# Patient Record
Sex: Female | Born: 1960 | Race: White | Hispanic: No | Smoking: Former smoker
Health system: Southern US, Community
[De-identification: ages and names within clinical notes are randomized; demographics above are authoritative.]

## PROBLEM LIST (undated history)

## (undated) DIAGNOSIS — R5383 Other fatigue: Secondary | ICD-10-CM

## (undated) DIAGNOSIS — E119 Type 2 diabetes mellitus without complications: Secondary | ICD-10-CM

## (undated) DIAGNOSIS — R159 Full incontinence of feces: Secondary | ICD-10-CM

## (undated) DIAGNOSIS — R32 Unspecified urinary incontinence: Secondary | ICD-10-CM

## (undated) DIAGNOSIS — D580 Hereditary spherocytosis: Secondary | ICD-10-CM

## (undated) DIAGNOSIS — D72824 Basophilia: Secondary | ICD-10-CM

## (undated) DIAGNOSIS — K8020 Calculus of gallbladder without cholecystitis without obstruction: Secondary | ICD-10-CM

## (undated) DIAGNOSIS — R937 Abnormal findings on diagnostic imaging of other parts of musculoskeletal system: Principal | ICD-10-CM

## (undated) DIAGNOSIS — D72829 Elevated white blood cell count, unspecified: Secondary | ICD-10-CM

## (undated) HISTORY — DX: Basophilia: D72.824

## (undated) HISTORY — PX: OTHER SURGICAL HISTORY: SHX169

## (undated) HISTORY — DX: Calculus of gallbladder without cholecystitis without obstruction: K80.20

## (undated) HISTORY — DX: Unspecified urinary incontinence: R32

## (undated) HISTORY — DX: Other fatigue: R53.83

## (undated) HISTORY — DX: Hereditary spherocytosis: D58.0

## (undated) HISTORY — DX: Abnormal findings on diagnostic imaging of other parts of musculoskeletal system: R93.7

## (undated) HISTORY — DX: Elevated white blood cell count, unspecified: D72.829

## (undated) HISTORY — DX: Full incontinence of feces: R15.9

## (undated) HISTORY — DX: Type 2 diabetes mellitus without complications: E11.9

---

## 1993-10-02 DIAGNOSIS — K8020 Calculus of gallbladder without cholecystitis without obstruction: Secondary | ICD-10-CM

## 1993-10-02 HISTORY — DX: Calculus of gallbladder without cholecystitis without obstruction: K80.20

## 2000-05-28 ENCOUNTER — Other Ambulatory Visit: Admission: RE | Admit: 2000-05-28 | Discharge: 2000-05-28 | Payer: Self-pay | Admitting: Family Medicine

## 2011-12-13 ENCOUNTER — Other Ambulatory Visit: Payer: Self-pay | Admitting: Family Medicine

## 2011-12-13 DIAGNOSIS — Z1231 Encounter for screening mammogram for malignant neoplasm of breast: Secondary | ICD-10-CM

## 2012-01-04 ENCOUNTER — Ambulatory Visit: Payer: Self-pay

## 2012-11-21 ENCOUNTER — Other Ambulatory Visit: Payer: Self-pay | Admitting: Family Medicine

## 2012-11-25 ENCOUNTER — Ambulatory Visit
Admission: RE | Admit: 2012-11-25 | Discharge: 2012-11-25 | Disposition: A | Discharge status: Home / Self Care | Payer: 59 | Source: Ambulatory Visit | Attending: Family Medicine | Admitting: Family Medicine

## 2012-11-25 DIAGNOSIS — R109 Unspecified abdominal pain: Secondary | ICD-10-CM

## 2012-11-25 MED ORDER — IOHEXOL 300 MG/ML  SOLN
100.0000 mL | Freq: Once | INTRAMUSCULAR | Status: AC | PRN
  Administered 2012-11-25: 100 mL via INTRAVENOUS

## 2013-11-14 ENCOUNTER — Other Ambulatory Visit: Payer: Self-pay | Admitting: Family Medicine

## 2013-11-14 DIAGNOSIS — M545 Low back pain, unspecified: Secondary | ICD-10-CM

## 2013-11-25 ENCOUNTER — Other Ambulatory Visit: Payer: 59

## 2014-01-28 ENCOUNTER — Other Ambulatory Visit: Payer: Self-pay | Admitting: Oncology

## 2014-01-28 ENCOUNTER — Encounter: Payer: Self-pay | Admitting: Oncology

## 2014-01-28 DIAGNOSIS — R161 Splenomegaly, not elsewhere classified: Secondary | ICD-10-CM

## 2014-01-28 DIAGNOSIS — R937 Abnormal findings on diagnostic imaging of other parts of musculoskeletal system: Secondary | ICD-10-CM

## 2014-01-28 HISTORY — DX: Abnormal findings on diagnostic imaging of other parts of musculoskeletal system: R93.7

## 2014-02-03 ENCOUNTER — Other Ambulatory Visit (INDEPENDENT_AMBULATORY_CARE_PROVIDER_SITE_OTHER): Payer: 59

## 2014-02-03 DIAGNOSIS — R937 Abnormal findings on diagnostic imaging of other parts of musculoskeletal system: Secondary | ICD-10-CM

## 2014-02-03 DIAGNOSIS — R161 Splenomegaly, not elsewhere classified: Secondary | ICD-10-CM

## 2014-02-03 LAB — COMPREHENSIVE METABOLIC PANEL
ALK PHOS: 87 U/L (ref 39–117)
ALT: 25 U/L (ref 0–35)
AST: 28 U/L (ref 0–37)
Albumin: 4 g/dL (ref 3.5–5.2)
BILIRUBIN TOTAL: 0.8 mg/dL (ref 0.3–1.2)
BUN: 12 mg/dL (ref 6–23)
CHLORIDE: 104 meq/L (ref 96–112)
CO2: 23 meq/L (ref 19–32)
Calcium: 9.3 mg/dL (ref 8.4–10.5)
Creatinine, Ser: 0.69 mg/dL (ref 0.50–1.10)
GFR calc Af Amer: >90 mL/min (ref >90)
Glucose, Bld: 211 mg/dL — ABNORMAL HIGH (ref 70–99)
POTASSIUM: 4.2 meq/L (ref 3.7–5.3)
Sodium: 141 mEq/L (ref 137–147)
Total Protein: 6.7 g/dL (ref 6.0–8.3)

## 2014-02-03 LAB — CBC WITH DIFFERENTIAL/PLATELET
BASOS ABS: 0.1 10*3/uL (ref 0.0–0.1)
Basophils Relative: 1 % (ref 0–1)
EOS PCT: 5 % (ref 0–5)
Eosinophils Absolute: 0.5 10*3/uL (ref 0.0–0.7)
HCT: 41.6 % (ref 36.0–46.0)
Hemoglobin: 15.2 g/dL — ABNORMAL HIGH (ref 12.0–15.0)
LYMPHS ABS: 3.4 10*3/uL (ref 0.7–4.0)
LYMPHS PCT: 35 % (ref 12–46)
MCH: 34.8 pg — ABNORMAL HIGH (ref 26.0–34.0)
MCHC: 36.5 g/dL — ABNORMAL HIGH (ref 30.0–36.0)
MCV: 95.2 fL (ref 78.0–100.0)
MONO ABS: 0.5 10*3/uL (ref 0.1–1.0)
Monocytes Relative: 5 % (ref 3–12)
NEUTROS ABS: 5.4 10*3/uL (ref 1.7–7.7)
Neutrophils Relative %: 54 % (ref 43–77)
Platelets: 180 10*3/uL (ref 150–400)
RBC: 4.37 MIL/uL (ref 3.87–5.11)
RDW: 17.5 % — AB (ref 11.5–15.5)
WBC: 9.9 10*3/uL (ref 4.0–10.5)

## 2014-02-03 LAB — LACTATE DEHYDROGENASE: LDH: 254 U/L — ABNORMAL HIGH (ref 94–250)

## 2014-02-03 LAB — RETICULOCYTES
RBC.: 4.37 MIL/uL (ref 3.87–5.11)
RETIC COUNT ABSOLUTE: 249.1 10*3/uL — AB (ref 19.0–186.0)
RETIC CT PCT: 5.7 % — AB (ref 0.4–3.1)

## 2014-02-03 LAB — SEDIMENTATION RATE: Sed Rate: 5 mm/hr (ref 0–22)

## 2014-02-03 LAB — SAVE SMEAR

## 2014-02-04 LAB — IGG, IGA, IGM
IGM, SERUM: 134 mg/dL (ref 52–322)
IgA: 185 mg/dL (ref 69–380)
IgG (Immunoglobin G), Serum: 955 mg/dL (ref 690–1700)

## 2014-02-04 LAB — ANA: Anti Nuclear Antibody(ANA): NEGATIVE

## 2014-02-05 LAB — IMMUNOFIXATION ELECTROPHORESIS
IGG (IMMUNOGLOBIN G), SERUM: 955 mg/dL (ref 690–1700)
IgA: 185 mg/dL (ref 69–380)
IgM, Serum: 134 mg/dL (ref 52–322)
TOTAL PROTEIN, SERUM ELECTROPHOR: 7 g/dL (ref 6.0–8.3)

## 2014-02-09 ENCOUNTER — Encounter: Payer: Self-pay | Admitting: Oncology

## 2014-02-09 ENCOUNTER — Ambulatory Visit (INDEPENDENT_AMBULATORY_CARE_PROVIDER_SITE_OTHER): Payer: 59 | Admitting: Oncology

## 2014-02-09 VITALS — BP 130/77 | HR 73 | Temp 98.9°F | Ht 66.5 in | Wt 205.8 lb

## 2014-02-09 DIAGNOSIS — D72824 Basophilia: Secondary | ICD-10-CM

## 2014-02-09 DIAGNOSIS — D580 Hereditary spherocytosis: Secondary | ICD-10-CM

## 2014-02-09 DIAGNOSIS — D72829 Elevated white blood cell count, unspecified: Secondary | ICD-10-CM

## 2014-02-09 DIAGNOSIS — R161 Splenomegaly, not elsewhere classified: Secondary | ICD-10-CM

## 2014-02-09 HISTORY — DX: Basophilia: D72.824

## 2014-02-09 HISTORY — DX: Elevated white blood cell count, unspecified: D72.829

## 2014-02-09 NOTE — Progress Notes (Signed)
Patient ID: Robyn Green, female   DOB: 09/01/1961, 53 y.o.   MRN: [id number] New Patient Hematology-Oncology Evaluation   Finn L Seevers [id number] March 19, 1961 53 y.o. 02/09/2014  CC: Dr. Claris Gower; Dr.Hao Mina Marble   Reason for referral: Abnormal bone marrow signal seen on an MRI done to evaluate acute on chronic back pain   HPI: 53 year old woman who has been in overall excellent health without any major medical or surgical illness. She has had chronic low back pain for over 10 years. About a year ago while stooping over at work she felt something pop. She has had severe midthoracic back pain since that time. She was referred to [place]. She had a MRI of the spine done on 01/20/2014. Findings included decreased T1 bone marrow signal throughout the thoracic and lumbar spine. No bone destruction. Widespread thoracic disc degeneration with additional degenerative changes. Disc protrusion at T2-3 which effaces the ventral CSF space on the right, a number of mild disc bulges at almost every level of the thoracic spine. I do not have a report on the lumbar spine MRI which was done the same day but I did review all scans with one of our neuro- radiologists. Although the bone marrow signal is abnormal, it is not specific. I looked back and found a CT scan of the abdomen done when she presented with right sided abdominal and flank pain on 11/25/2012. Mild splenomegaly, 16 cm, noted on that study. No organomegaly. Suspicion for early hepatic steatosis. She has had a number of laboratory tests done over the last year. On 11/19/2012, hemoglobin 14.3, MCV 99, white count 11,200, 53 neutrophils, 33 lymphocytes, 7 monocytes, 5 eosinophils, 1 basophil. No platelet count done. On 01/23/2014: Hemoglobin 14.8, white count 11,100, 82 neutrophils, 38 lymphocytes, 5 monocytes, 4 eosinophils, 1 basophil. Serum protein electrophoresis did not show a monoclonal protein spike. Repeat labs done in anticipation  of today's visit on 02/03/2014 with hemoglobin 15.1, MCV 95, white count 9900, platelets 180,000, ESR 5 mm, liver functions normal. Reticulocyte count elevated at 5.7%, LDH mildly elevated at 254, bilirubin normal at 0.8. ANA negative. Quantitative immunoglobulins are normal. No monoclonal protein seen on immunofixation electrophoresis of the serum.  She has never had any left-sided abdominal pain or discomfort. No early satiety. She had a laparoscopic cholecystectomy in 1994. She had mononucleosis as a teenager. 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 [place] The word brucellosis was not familiar to her.  On further questioning about family history, she tells me that her maternal grandmother, many maternal aunts and uncles, and their spleens out. Somebody mentioned hereditary spherocytosis. Her 83 year old mother accompanies her today. She has not had any blood problems. Ms. Isaacson has 2 brothers aged 44 and 23 with no known blood problems. A sister died at age 2 of liver cancer and a ruptured appendix.   PMH: Past Medical History  Diagnosis Date  . Abnormal x-ray of thoracic spine 01/28/2014    MRI thoracic spine 01/20/14: Diffuse abnormal bone marrow signal "this is non-specific and could reflect benign or malignant etiology"  . Leukocytosis, unspecified 02/09/2014  . 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 Negative MI. Negative stomach ulcers. Transient diabetes resolved with weight loss. No thyroid disease. She denies any history of hepatitis, yellow jaundice, or malaria. No seizure or stroke. No signs or symptoms  of a collagen vascular disorder. Chickenpox as a child. Mononucleosis as a teenager.  surgical history: See history of present illness  Allergies: Allergies  Allergen Reactions  . Bee Venom   . Codeine      Medications: No current outpatient prescriptions on file.  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 She continues to try to quit but usually goes back to smoking again; she does not drink alcohol or use illicit drugs.  Family History: See history of present illness  Review of Systems: She still gets some intermittent right-sided abdominal pain. She has almost constant pain in her spine from her neck all the way down to her sacrum. Pain radiating down the back of both legs. She had a early menopause in her 41s. She gets an occasional headache. No change in vision. No diplopia. No paresthesias. No cough or dyspnea. No ischemic type chest pain or palpitations. No change in bowel habit. Previous sigmoidoscopy reported to her as normal. She has never had a mammogram. No urinary tract symptoms. No rash or ecchymosis.   Physical Exam: Blood pressure 130/77, pulse 73, temperature 98.9 F (37.2 C), temperature source Oral, height 5' 6.5" (1.689 m), weight 205 lb 12.8 oz (93.35 kg), SpO2 97.00%. Wt Readings from Last 3 Encounters:  02/09/14 205 lb 12.8 oz (93.35 kg)     General appearance: Well-nourished Caucasian woman HENNT: Pharynx no erythema, exudate, mass, or ulcer. No thyromegaly or thyroid nodules Lymph nodes: No cervical, supraclavicular, or axillary lymphadenopathy Breasts:  Lungs: Clear to auscultation, resonant to percussion throughout Heart: Regular rhythm, no murmur, no gallop, no rub, no click, no edema Abdomen: Soft, nontender, normal bowel sounds, no mass, no organomegaly. I was unable to palpate an enlarged spleen either supine or in the right lateral decubitus position. Extremities: No edema, no calf tenderness Musculoskeletal: no joint deformities GU:  Vascular: Carotid pulses 2+, no bruits, distal pulses: Dorsalis pedis 1+ symmetric Neurologic: Alert, oriented, PERRLA, optic discs sharp  and vessels normal, no hemorrhage or exudate, cranial nerves grossly normal, motor strength 5 over 5, reflexes 1+ symmetric, upper body coordination normal, gait normal, vibration sensation normal to tuning fork exam over the fingertips and feet. Skin: No rash or ecchymosis    Lab Results: Lab Results  Component Value Date   WBC 9.9 02/03/2014   HGB 15.2* 02/03/2014   HCT 41.6 02/03/2014   MCV 95.2 02/03/2014   PLT 180 02/03/2014     Chemistry      Component Value Date/Time   NA 141 02/03/2014 0932   K 4.2 02/03/2014 0932   CL 104 02/03/2014 0932   CO2 23 02/03/2014 0932   BUN 12 02/03/2014 0932   CREATININE 0.69 02/03/2014 0932      Component Value Date/Time   CALCIUM 9.3 02/03/2014 0932   ALKPHOS 87 02/03/2014 0932   AST 28 02/03/2014 0932   ALT 25 02/03/2014 0932   BILITOT 0.8 02/03/2014 0932      Review of peripheral blood film: Homogeneous population of spherocytes. 2+ polychromasia. No basophilic stippling. No Red cell inclusions. No schistocytes. Rare teardrop cell. Mature neutrophils and lymphocytes. Normal platelets.   Radiological Studies: See discussion above     Impression and Plan: Spherocytosis and polychromasia on peripheral blood film, mild splenomegaly, signs of  a hemolytic process  with elevated reticulocyte count and LDH  correlated with diffuse abnormal bone marrow signal on MRI and family history with  many members having had their spleens removed, would correlate with a diagnosis of hereditary spherocytosis. Atypical features in this woman include the normal hemoglobin, only borderline elevation of the MCHC, and an MCV which is disproportionately elevated for the degree of reticulocytosis and normal bilirubin.  I would include a BCR- abl or non BCR- abl myeloproliferative disorder in my differential at this time.  I'm going to obtain a haptoglobin, BCR abl analysis, and a JAK 2 mutation analysis. Based on these tests, I will decide on whether or not to do osmotic fragility testing  or more specific tests to look for defective membrane proteins spectrin, ankyrin, and band 3.  I do not believe that the nonspecific findings in the bone marrow are related to her back pain. The degree of hemolysis is mild given that she is not even anemic. Her spleen is only borderline enlarged so there is only minimal evidence of ineffective erythropoiesis. My clinical impression is that her back pain is still orthopedic in nature and should be treated as such.        Annia Belt, MD 02/09/2014, 5:44 PM

## 2014-02-09 NOTE — Patient Instructions (Signed)
To lab today Return visit on June 2 at 11:30 AM to review results

## 2014-02-10 LAB — HAPTOGLOBIN

## 2014-03-03 ENCOUNTER — Encounter: Payer: Self-pay | Admitting: Oncology

## 2014-03-03 ENCOUNTER — Ambulatory Visit: Payer: 59 | Attending: Family Medicine | Admitting: Physical Therapy

## 2014-03-03 ENCOUNTER — Ambulatory Visit (INDEPENDENT_AMBULATORY_CARE_PROVIDER_SITE_OTHER): Payer: 59 | Admitting: Oncology

## 2014-03-03 VITALS — BP 120/77 | HR 62 | Temp 97.5°F | Resp 20 | Ht 64.0 in | Wt 205.7 lb

## 2014-03-03 DIAGNOSIS — M545 Low back pain, unspecified: Secondary | ICD-10-CM | POA: Insufficient documentation

## 2014-03-03 DIAGNOSIS — M546 Pain in thoracic spine: Secondary | ICD-10-CM | POA: Insufficient documentation

## 2014-03-03 DIAGNOSIS — IMO0001 Reserved for inherently not codable concepts without codable children: Secondary | ICD-10-CM | POA: Insufficient documentation

## 2014-03-03 DIAGNOSIS — D580 Hereditary spherocytosis: Secondary | ICD-10-CM

## 2014-03-03 DIAGNOSIS — R161 Splenomegaly, not elsewhere classified: Secondary | ICD-10-CM

## 2014-03-03 DIAGNOSIS — M2560 Stiffness of unspecified joint, not elsewhere classified: Secondary | ICD-10-CM | POA: Insufficient documentation

## 2014-03-03 HISTORY — DX: Hereditary spherocytosis: D58.0

## 2014-03-03 LAB — CBC WITH DIFFERENTIAL/PLATELET
BASOS ABS: 0.1 10*3/uL (ref 0.0–0.1)
Basophils Relative: 1 % (ref 0–1)
EOS PCT: 7 % — AB (ref 0–5)
Eosinophils Absolute: 0.8 10*3/uL — ABNORMAL HIGH (ref 0.0–0.7)
HCT: 40.2 % (ref 36.0–46.0)
Hemoglobin: 14.6 g/dL (ref 12.0–15.0)
LYMPHS PCT: 36 % (ref 12–46)
Lymphs Abs: 3.9 10*3/uL (ref 0.7–4.0)
MCH: 34.7 pg — ABNORMAL HIGH (ref 26.0–34.0)
MCHC: 36.3 g/dL — ABNORMAL HIGH (ref 30.0–36.0)
MCV: 95.5 fL (ref 78.0–100.0)
Monocytes Absolute: 0.5 10*3/uL (ref 0.1–1.0)
Monocytes Relative: 5 % (ref 3–12)
NEUTROS ABS: 5.6 10*3/uL (ref 1.7–7.7)
Neutrophils Relative %: 51 % (ref 43–77)
PLATELETS: 170 10*3/uL (ref 150–400)
RBC: 4.21 MIL/uL (ref 3.87–5.11)
RDW: 16.6 % — ABNORMAL HIGH (ref 11.5–15.5)
WBC: 10.9 10*3/uL — ABNORMAL HIGH (ref 4.0–10.5)

## 2014-03-03 LAB — RETICULOCYTES
ABS RETIC: 210.5 10*3/uL — AB (ref 19.0–186.0)
RBC.: 4.21 MIL/uL (ref 3.87–5.11)
Retic Ct Pct: 5 % — ABNORMAL HIGH (ref 0.4–2.3)

## 2014-03-03 NOTE — Patient Instructions (Addendum)
Lab work today Return to see Dr Reece Agar in 6 months 15 minutes 12/15  Lab 1 week before

## 2014-03-03 NOTE — Progress Notes (Signed)
Patient ID: Robyn Green, female   DOB: 10/19/1960, 53 y.o.   MRN: [id number] Followup visit for this pleasant 67 woman and her mother to discuss results of recent laboratory evaluation prompted by referral for abnormal bone marrow signal on MRI done for further evaluation of acute on chronic back pain. I reviewed the MRI studies with the radiologist. Findings were nonspecific. No focal malignant appearing bone disease. In reviewing available data, I found a CT scan of the abdomen done in February 2014 which reported mild splenomegaly 16 cm without any lymphadenopathy or obvious liver pathology other than hepatic steatosis.  On questioning her about her family history, she told me that there was a family history of a blood disorder and she remembered somebody calling it "hereditary spherocytosis". A number of family members have had splenectomies.   On my exam of 02/09/2014 I was unable to palpate an enlarged spleen.  I reviewed her peripheral blood film. She did in fact have a homogeneous population of spherocytes. 2+ polychromasia. No basophilic stippling. No Red cell inclusions. No schistocytes. Rare teardrop cell. Mature neutrophils and lymphocytes. Normal platelets. No blasts or other immature myeloid cells.  Lab done through this office showed normal serum immunoglobulins with no monoclonal proteins on immunofixation electrophoresis, but there were signs of low-grade hemolysis with undetectable haptoglobin, borderline elevation in LDH of 254 with lab normal less than 250, elevated reticulocyte count of 5.7%. However, there were a number of other features that didn't quite fit. First and foremost, she is not anemic with a hemoglobin of 15.5. Her MCV was not low, in fact it was borderline elevated at 95, MCHC, typically increased in hereditary spherocytosis, was at the upper end of normal. There were 1% basophils on the white count differential and white count was upper limit of normal at 9900. I  ordered a number of additional tests to exclude an underlying myeloproliferative disorder. She tested negative for the BCR-ABL gene mutation that is associated with chronic myeloid leukemia and she tested negative for the JAK-2 mutation commonly seen with non-BCR-ABL myeloproliferative disorders.  We discussed the probable diagnosis of hereditary spherocytosis today and I drew her some pictures to help explain this particular blood problem. I still do not feel that the nonspecific pattern on her MRI in the bone marrow is in any way related to her back pain  I am going to go ahead and send a blood sample off for osmotic fragility testing. More sophisticated testing can be done to look at red cell membrane proteins if indicated based on results. Given the results of the tests outlined above, I do not see any indication for a bone marrow biopsy at this time.  Approximately 30 minutes spent with direct face-to-face patient contact with 100% in counseling, review and explanation of data.

## 2014-03-16 ENCOUNTER — Ambulatory Visit: Payer: 59 | Admitting: Physical Therapy

## 2014-03-24 ENCOUNTER — Ambulatory Visit: Payer: 59 | Admitting: Physical Therapy

## 2014-03-27 ENCOUNTER — Ambulatory Visit: Payer: 59 | Admitting: Physical Therapy

## 2014-03-30 ENCOUNTER — Encounter: Payer: 59 | Admitting: Physical Therapy

## 2014-03-30 ENCOUNTER — Ambulatory Visit: Payer: 59 | Admitting: Physical Therapy

## 2014-04-01 ENCOUNTER — Encounter: Payer: 59 | Admitting: Physical Therapy

## 2014-04-02 ENCOUNTER — Ambulatory Visit: Payer: 59 | Attending: Family Medicine | Admitting: Physical Therapy

## 2014-04-02 DIAGNOSIS — M546 Pain in thoracic spine: Secondary | ICD-10-CM | POA: Insufficient documentation

## 2014-04-02 DIAGNOSIS — M545 Low back pain, unspecified: Secondary | ICD-10-CM | POA: Diagnosis not present

## 2014-04-02 DIAGNOSIS — IMO0001 Reserved for inherently not codable concepts without codable children: Secondary | ICD-10-CM | POA: Insufficient documentation

## 2014-04-02 DIAGNOSIS — M2560 Stiffness of unspecified joint, not elsewhere classified: Secondary | ICD-10-CM | POA: Diagnosis not present

## 2014-04-13 ENCOUNTER — Ambulatory Visit: Payer: 59 | Admitting: Physical Therapy

## 2014-04-13 DIAGNOSIS — IMO0001 Reserved for inherently not codable concepts without codable children: Secondary | ICD-10-CM | POA: Diagnosis not present

## 2014-04-15 ENCOUNTER — Ambulatory Visit: Payer: 59 | Admitting: Physical Therapy

## 2014-04-15 DIAGNOSIS — IMO0001 Reserved for inherently not codable concepts without codable children: Secondary | ICD-10-CM | POA: Diagnosis not present

## 2014-04-20 ENCOUNTER — Encounter: Payer: 59 | Admitting: Physical Therapy

## 2014-04-22 ENCOUNTER — Ambulatory Visit: Payer: 59 | Admitting: Physical Therapy

## 2014-04-22 DIAGNOSIS — IMO0001 Reserved for inherently not codable concepts without codable children: Secondary | ICD-10-CM | POA: Diagnosis not present

## 2014-04-28 ENCOUNTER — Ambulatory Visit: Payer: 59 | Admitting: Physical Therapy

## 2014-04-28 DIAGNOSIS — IMO0001 Reserved for inherently not codable concepts without codable children: Secondary | ICD-10-CM | POA: Diagnosis not present

## 2014-05-01 ENCOUNTER — Ambulatory Visit: Payer: 59 | Admitting: Physical Therapy

## 2014-05-01 DIAGNOSIS — IMO0001 Reserved for inherently not codable concepts without codable children: Secondary | ICD-10-CM | POA: Diagnosis not present

## 2014-05-04 ENCOUNTER — Ambulatory Visit: Payer: 59 | Attending: Family Medicine | Admitting: Physical Therapy

## 2014-05-04 DIAGNOSIS — M546 Pain in thoracic spine: Secondary | ICD-10-CM | POA: Diagnosis not present

## 2014-05-04 DIAGNOSIS — IMO0001 Reserved for inherently not codable concepts without codable children: Secondary | ICD-10-CM | POA: Diagnosis present

## 2014-05-04 DIAGNOSIS — M2560 Stiffness of unspecified joint, not elsewhere classified: Secondary | ICD-10-CM | POA: Diagnosis not present

## 2014-05-04 DIAGNOSIS — M545 Low back pain, unspecified: Secondary | ICD-10-CM | POA: Diagnosis not present

## 2014-05-06 ENCOUNTER — Ambulatory Visit: Payer: 59

## 2014-05-06 DIAGNOSIS — IMO0001 Reserved for inherently not codable concepts without codable children: Secondary | ICD-10-CM | POA: Diagnosis not present

## 2014-05-08 ENCOUNTER — Encounter: Payer: 59 | Admitting: Physical Therapy

## 2014-05-12 ENCOUNTER — Ambulatory Visit: Payer: 59 | Admitting: Physical Therapy

## 2014-05-12 DIAGNOSIS — IMO0001 Reserved for inherently not codable concepts without codable children: Secondary | ICD-10-CM | POA: Diagnosis not present

## 2014-05-15 ENCOUNTER — Ambulatory Visit: Payer: 59 | Admitting: Physical Therapy

## 2014-05-26 ENCOUNTER — Ambulatory Visit: Payer: 59

## 2014-05-26 DIAGNOSIS — IMO0001 Reserved for inherently not codable concepts without codable children: Secondary | ICD-10-CM | POA: Diagnosis not present

## 2014-06-01 ENCOUNTER — Other Ambulatory Visit: Payer: Self-pay

## 2014-06-01 DIAGNOSIS — Z1231 Encounter for screening mammogram for malignant neoplasm of breast: Secondary | ICD-10-CM

## 2014-06-11 ENCOUNTER — Ambulatory Visit
Admission: RE | Admit: 2014-06-11 | Discharge: 2014-06-11 | Disposition: A | Discharge status: Home / Self Care | Payer: 59 | Source: Ambulatory Visit

## 2014-06-11 DIAGNOSIS — Z1231 Encounter for screening mammogram for malignant neoplasm of breast: Secondary | ICD-10-CM

## 2014-08-02 HISTORY — PX: CERVICAL SPINE SURGERY: SHX589

## 2014-08-14 ENCOUNTER — Observation Stay (HOSPITAL_COMMUNITY)
Admission: AD | Admit: 2014-08-14 | Discharge: 2014-08-15 | Disposition: A | Discharge status: Home / Self Care | Payer: 59 | Source: Ambulatory Visit | Attending: Neurological Surgery | Admitting: Neurological Surgery

## 2014-08-14 DIAGNOSIS — Z885 Allergy status to narcotic agent status: Secondary | ICD-10-CM | POA: Insufficient documentation

## 2014-08-14 DIAGNOSIS — M4722 Other spondylosis with radiculopathy, cervical region: Secondary | ICD-10-CM

## 2014-08-14 DIAGNOSIS — Z91030 Bee allergy status: Secondary | ICD-10-CM | POA: Diagnosis not present

## 2014-08-14 DIAGNOSIS — Z87891 Personal history of nicotine dependence: Secondary | ICD-10-CM | POA: Diagnosis not present

## 2014-08-14 DIAGNOSIS — T40605A Adverse effect of unspecified narcotics, initial encounter: Secondary | ICD-10-CM | POA: Insufficient documentation

## 2014-08-14 DIAGNOSIS — Y92234 Operating room of hospital as the place of occurrence of the external cause: Secondary | ICD-10-CM | POA: Insufficient documentation

## 2014-08-14 DIAGNOSIS — R451 Restlessness and agitation: Secondary | ICD-10-CM | POA: Diagnosis not present

## 2014-08-14 DIAGNOSIS — M4712 Other spondylosis with myelopathy, cervical region: Secondary | ICD-10-CM | POA: Diagnosis present

## 2014-08-14 MED ORDER — DIAZEPAM 5 MG PO TABS: 5.0000 mg | ORAL_TABLET | Freq: Four times a day (QID) | ORAL | Status: DC | PRN

## 2014-08-14 MED ORDER — DIAZEPAM 5 MG PO TABS
5.0000 mg | ORAL_TABLET | Freq: Four times a day (QID) | ORAL | Status: DC | PRN
  Administered 2014-08-14 – 2014-08-15 (×2): 5 mg via ORAL
  Filled 2014-08-14 (×2): qty 1

## 2014-08-14 MED ORDER — ACETAMINOPHEN 325 MG PO TABS: 650.0000 mg | ORAL_TABLET | Freq: Four times a day (QID) | ORAL | Status: DC | PRN

## 2014-08-14 MED ORDER — ONDANSETRON HCL 4 MG PO TABS
4.0000 mg | ORAL_TABLET | Freq: Four times a day (QID) | ORAL | Status: DC | PRN
  Administered 2014-08-15: 4 mg via ORAL

## 2014-08-14 MED ORDER — DOCUSATE SODIUM 100 MG PO CAPS
100.0000 mg | ORAL_CAPSULE | Freq: Two times a day (BID) | ORAL | Status: DC
  Administered 2014-08-14: 100 mg via ORAL
  Filled 2014-08-14: qty 1

## 2014-08-14 MED ORDER — KETOROLAC TROMETHAMINE 15 MG/ML IJ SOLN
15.0000 mg | Freq: Four times a day (QID) | INTRAMUSCULAR | Status: DC
  Administered 2014-08-14 – 2014-08-15 (×2): 15 mg via INTRAVENOUS
  Filled 2014-08-14 (×2): qty 1

## 2014-08-14 MED ORDER — ACETAMINOPHEN 650 MG RE SUPP: 650.0000 mg | Freq: Four times a day (QID) | RECTAL | Status: DC | PRN

## 2014-08-14 MED ORDER — HYDROCODONE-ACETAMINOPHEN 5-325 MG PO TABS
1.0000 | ORAL_TABLET | ORAL | Status: DC | PRN
  Administered 2014-08-14: 2 via ORAL
  Filled 2014-08-14: qty 2

## 2014-08-14 MED ORDER — ALUM & MAG HYDROXIDE-SIMETH 200-200-20 MG/5ML PO SUSP: 30.0000 mL | Freq: Four times a day (QID) | ORAL | Status: DC | PRN

## 2014-08-14 MED ORDER — ONDANSETRON HCL 4 MG/2ML IJ SOLN
4.0000 mg | Freq: Four times a day (QID) | INTRAMUSCULAR | Status: DC | PRN
  Administered 2014-08-14: 4 mg via INTRAVENOUS
  Filled 2014-08-14 (×2): qty 2

## 2014-08-14 MED ORDER — HYDROCODONE-ACETAMINOPHEN 5-325 MG PO TABS: 1.0000 | ORAL_TABLET | Freq: Four times a day (QID) | ORAL | Status: AC | PRN

## 2014-08-14 MED ORDER — SENNA 8.6 MG PO TABS
1.0000 | ORAL_TABLET | Freq: Two times a day (BID) | ORAL | Status: DC
  Administered 2014-08-14: 8.6 mg via ORAL
  Filled 2014-08-14: qty 1

## 2014-08-14 NOTE — Progress Notes (Signed)
[facility] at 18:12pm asleep, responding to voice and pain. Pt able to follow simple commands. Neuro intact. No noted distress. Cervical collar in place for comfort per MD who came to give report to this nurse. Pt stated, " My back is killing me." Currently positioned on her side for relief.  MD to place initial orders. Pt stable at this time educated and oriented to room. Safety measures in place. Call bell within reach.

## 2014-08-14 NOTE — H&P (Signed)
Robyn Green is an 53 y.o. female.   Chief Complaint:postoperative oversedation HPI: patient is a 53 year old individual who [facility]. She had a 2 level anterior decompression arthrodesis at C5-6 and C6-C7. Postoperatively the patient awoke quite agitated. She complained intermittently of pain and feeling severe heat area her vital signs were stable. She requires substantial sedation in the postanesthesia care unit. Ultimately it was felt that she may be adversely reacting to the narcotic analgesic. After receiving some benzodiazepines she gradually settled down. However late in the day it was noted that she was still rather somnolent, she had not ambulated, should not avoided. It was advised that she be transferred to a care facility for overnight stay. She is now admitted to the hospital.  Past Medical History  Diagnosis Date  . Abnormal x-ray of thoracic spine 01/28/2014    MRI thoracic spine 01/20/14: Diffuse abnormal bone marrow signal "this is non-specific and could reflect benign or malignant etiology"  . Leukocytosis, unspecified 02/09/2014  . 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  No family history on file. Social History:  reports that she quit smoking about 8 months ago. She does not have any smokeless tobacco history on file. She reports that she does not drink alcohol or use illicit drugs.  Allergies:  Allergies  Allergen Reactions  . Bee Venom   . Codeine     No prescriptions prior to admission    No results found for this or any previous visit (from the past 48 hour(s)). No results found.  Review of Systems  Constitutional: Negative.   Eyes: Negative.   Respiratory: Negative.   Cardiovascular: Negative.   Gastrointestinal: Negative.   Genitourinary: Negative.   Musculoskeletal: Positive for neck pain.  Skin: Negative.   Neurological:       Mild weakness  in upper extremities  Endo/Heme/Allergies: Negative.   Psychiatric/Behavioral: Negative.     Blood pressure 123/42, pulse 48, temperature 97.1 F (36.2 C), temperature source Oral, resp. rate 16, SpO2 98 %. Physical Exam  Constitutional: She is oriented to person, place, and time. She appears well-developed and well-nourished.  HENT:  Head: Normocephalic and atraumatic.  Eyes: Conjunctivae and EOM are normal. Pupils are equal, round, and reactive to light.  Neck: Normal range of motion. Neck supple.  Cardiovascular: Normal rate and regular rhythm.   Respiratory: Effort normal and breath sounds normal.  GI: Soft. Bowel sounds are normal.  Musculoskeletal: Normal range of motion.  Neurological: She is alert and oriented to person, place, and time. She has normal reflexes.  Skin: Skin is warm and dry.  Psychiatric: She has a normal mood and affect. Her behavior is normal. Judgment and thought content normal.     Assessment/Plan Postoperative oversedation after anterior cervical decompression arthrodesis.  Patient will be observed in-hospital and gradually mobilized as she tolerates. She will be given limited narcotic analgesics. We will use Toradol for pain control.  Conall Vangorder J 08/14/2014, 6:28 PM

## 2014-08-15 DIAGNOSIS — R451 Restlessness and agitation: Secondary | ICD-10-CM | POA: Diagnosis not present

## 2014-08-15 MED ORDER — OXYCODONE-ACETAMINOPHEN 5-325 MG PO TABS: 1.0000 | ORAL_TABLET | ORAL | Status: DC | PRN

## 2014-08-15 MED ORDER — CYCLOBENZAPRINE HCL 10 MG PO TABS: 10.0000 mg | ORAL_TABLET | Freq: Three times a day (TID) | ORAL | Status: DC | PRN

## 2014-08-15 NOTE — Discharge Summary (Signed)
Physician Discharge Summary  Patient ID: Robyn Green MRN: [id number] DOB/AGE: [birth year] 53 y.o.  Admit date: 08/14/2014 Discharge date: 08/15/2014  Admission Diagnoses:Oversedation following anterior cervical discectomy  Discharge Diagnoses: Oversedation following anterior cervical discectomy  Active Problems:   Cervical spondylosis with myelopathy and radiculopathy   Discharged Condition: good  Hospital Course: Patient was gradually improved with ambulation and sensorium.  Doing well next am and discharged home  Consults: None  Significant Diagnostic Studies: None  Treatments: Observation after surgery with pain management  Discharge Exam: Blood pressure 143/66, pulse 55, temperature 97.9 F (36.6 C), temperature source Oral, resp. rate 18, SpO2 96 %. Neurologic: Alert and oriented X 3, normal strength and tone. Normal symmetric reflexes. Normal coordination and gait Wound:CDI  Disposition: Home  Discharge Instructions    Call MD for:  redness, tenderness, or signs of infection (pain, swelling, redness, odor or green/yellow discharge around incision site)    Complete by:  As directed      Call MD for:  severe uncontrolled pain    Complete by:  As directed      Call MD for:  temperature >100.4    Complete by:  As directed      Diet - low sodium heart healthy    Complete by:  As directed      Discharge instructions    Complete by:  As directed   Okay to shower. Do not apply salves or appointments to incision. No heavy lifting with the upper extremities greater than 15 pounds. May resume driving when not requiring pain medication and patient feels comfortable with doing so.     Increase activity slowly    Complete by:  As directed             Medication List    TAKE these medications        cyclobenzaprine 10 MG tablet  Commonly known as:  FLEXERIL  Take 1 tablet (10 mg total) by mouth 3 (three) times daily as needed for muscle spasms.     diazepam 5 MG  tablet  Commonly known as:  VALIUM  Take 1 tablet (5 mg total) by mouth every 6 (six) hours as needed for anxiety.     HYDROcodone-acetaminophen 5-325 MG per tablet  Commonly known as:  NORCO  Take 1 tablet by mouth every 6 (six) hours as needed for moderate pain.     oxyCODONE-acetaminophen 5-325 MG per tablet  Commonly known as:  ROXICET  Take 1-2 tablets by mouth every 4 (four) hours as needed for moderate pain or severe pain.         Signed: Dorian HeckleSTERN,Travonne Schowalter D, MD 08/15/2014, 6:33 AM

## 2014-08-15 NOTE — Progress Notes (Signed)
Subjective: Patient reports much better this morning  Objective: Vital signs in last 24 hours: Temp:  [97.1 F (36.2 C)-97.9 F (36.6 C)] 97.9 F (36.6 C) (11/13 2330) Pulse Rate:  [48-55] 55 (11/13 2330) Resp:  [16-18] 18 (11/13 2330) BP: (123-143)/(42-66) 143/66 mmHg (11/13 2330) SpO2:  [96 %-98 %] 96 % (11/13 2330)  Intake/Output from previous day:   Intake/Output this shift:    Physical Exam: Awake, alert, conversant.  Full strength, dressing CDI  Lab Results: No results for input(s): WBC, HGB, HCT, PLT in the last 72 hours. BMET No results for input(s): NA, K, CL, CO2, GLUCOSE, BUN, CREATININE, CALCIUM in the last 72 hours.  Studies/Results: No results found.  Assessment/Plan: Doing well.  Effects of sedation have worn off.  D/C home.    LOS: 1 day    Dorian HeckleSTERN,Ares Tegtmeyer D, MD 08/15/2014, 6:30 AM

## 2014-08-19 ENCOUNTER — Other Ambulatory Visit (HOSPITAL_COMMUNITY): Payer: Self-pay | Admitting: Neurological Surgery

## 2014-08-19 ENCOUNTER — Emergency Department (HOSPITAL_COMMUNITY)
Admission: EM | Admit: 2014-08-19 | Discharge: 2014-08-19 | Disposition: A | Discharge status: Home / Self Care | Payer: 59 | Attending: Emergency Medicine | Admitting: Emergency Medicine

## 2014-08-19 ENCOUNTER — Ambulatory Visit (HOSPITAL_COMMUNITY)
Admission: RE | Admit: 2014-08-19 | Discharge: 2014-08-19 | Disposition: A | Discharge status: Home / Self Care | Payer: 59 | Source: Ambulatory Visit | Attending: Neurological Surgery | Admitting: Neurological Surgery

## 2014-08-19 ENCOUNTER — Encounter (HOSPITAL_COMMUNITY): Payer: Self-pay | Admitting: *Deleted

## 2014-08-19 DIAGNOSIS — M79605 Pain in left leg: Secondary | ICD-10-CM

## 2014-08-19 DIAGNOSIS — Z87891 Personal history of nicotine dependence: Secondary | ICD-10-CM | POA: Diagnosis not present

## 2014-08-19 DIAGNOSIS — I82812 Embolism and thrombosis of superficial veins of left lower extremities: Secondary | ICD-10-CM | POA: Diagnosis not present

## 2014-08-19 DIAGNOSIS — Z79899 Other long term (current) drug therapy: Secondary | ICD-10-CM | POA: Diagnosis not present

## 2014-08-19 DIAGNOSIS — D72829 Elevated white blood cell count, unspecified: Secondary | ICD-10-CM | POA: Insufficient documentation

## 2014-08-19 DIAGNOSIS — I809 Phlebitis and thrombophlebitis of unspecified site: Secondary | ICD-10-CM

## 2014-08-19 DIAGNOSIS — I82402 Acute embolism and thrombosis of unspecified deep veins of left lower extremity: Secondary | ICD-10-CM

## 2014-08-19 LAB — PROTIME-INR
INR: 1.08 (ref 0.00–1.49)
Prothrombin Time: 14.1 seconds (ref 11.6–15.2)

## 2014-08-19 LAB — COMPREHENSIVE METABOLIC PANEL
ALBUMIN: 3.9 g/dL (ref 3.5–5.2)
ALK PHOS: 72 U/L (ref 39–117)
ALT: 32 U/L (ref 0–35)
AST: 25 U/L (ref 0–37)
Anion gap: 12 (ref 5–15)
BUN: 9 mg/dL (ref 6–23)
CHLORIDE: 100 meq/L (ref 96–112)
CO2: 28 meq/L (ref 19–32)
Calcium: 9.4 mg/dL (ref 8.4–10.5)
Creatinine, Ser: 0.66 mg/dL (ref 0.50–1.10)
GFR calc non Af Amer: >90 mL/min (ref >90)
GLUCOSE: 177 mg/dL — AB (ref 70–99)
POTASSIUM: 4.1 meq/L (ref 3.7–5.3)
Sodium: 140 mEq/L (ref 137–147)
Total Bilirubin: 1.6 mg/dL — ABNORMAL HIGH (ref 0.3–1.2)
Total Protein: 6.8 g/dL (ref 6.0–8.3)

## 2014-08-19 LAB — CBC WITH DIFFERENTIAL/PLATELET
Basophils Absolute: 0 10*3/uL (ref 0.0–0.1)
Basophils Relative: 0 % (ref 0–1)
Eosinophils Absolute: 0.5 10*3/uL (ref 0.0–0.7)
Eosinophils Relative: 4 % (ref 0–5)
HEMATOCRIT: 35.4 % — AB (ref 36.0–46.0)
HEMOGLOBIN: 13 g/dL (ref 12.0–15.0)
LYMPHS ABS: 4.2 10*3/uL — AB (ref 0.7–4.0)
Lymphocytes Relative: 39 % (ref 12–46)
MCH: 34.5 pg — ABNORMAL HIGH (ref 26.0–34.0)
MCHC: 36.7 g/dL — ABNORMAL HIGH (ref 30.0–36.0)
MCV: 93.9 fL (ref 78.0–100.0)
MONOS PCT: 6 % (ref 3–12)
Monocytes Absolute: 0.7 10*3/uL (ref 0.1–1.0)
NEUTROS ABS: 5.4 10*3/uL (ref 1.7–7.7)
Neutrophils Relative %: 50 % (ref 43–77)
Platelets: 148 10*3/uL — ABNORMAL LOW (ref 150–400)
RBC: 3.77 MIL/uL — AB (ref 3.87–5.11)
RDW: 16.7 % — ABNORMAL HIGH (ref 11.5–15.5)
WBC: 10.7 10*3/uL — ABNORMAL HIGH (ref 4.0–10.5)

## 2014-08-19 MED ORDER — RIVAROXABAN 20 MG PO TABS: 20.0000 mg | ORAL_TABLET | Freq: Every day | ORAL | Status: DC

## 2014-08-19 MED ORDER — RIVAROXABAN 15 MG PO TABS
15.0000 mg | ORAL_TABLET | Freq: Two times a day (BID) | ORAL | Status: DC
  Administered 2014-08-19: 15 mg via ORAL
  Filled 2014-08-19: qty 1

## 2014-08-19 MED ORDER — XARELTO VTE STARTER PACK 15 & 20 MG PO TBPK: 15.0000 mg | ORAL_TABLET | ORAL | Status: DC

## 2014-08-19 MED ORDER — RIVAROXABAN (XARELTO) EDUCATION KIT FOR DVT/PE PATIENTS
PACK | Freq: Once | Status: AC
  Administered 2014-08-19: 21:00:00
  Filled 2014-08-19: qty 1

## 2014-08-19 NOTE — ED Notes (Signed)
Pt left refusing to sign e-signature

## 2014-08-19 NOTE — Progress Notes (Signed)
VASCULAR LAB PRELIMINARY  PRELIMINARY  PRELIMINARY  PRELIMINARY  Bilateral lower extremity venous duplex  completed.    Preliminary report:  Right:  No evidence of DVT, superficial thrombosis, or Baker's cyst.  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  No evidence of superficial thrombosis.  No Baker's cyst.   Advay Volante, RVT 08/19/2014, 5:39 PM

## 2014-08-19 NOTE — Discharge Instructions (Signed)
Deep Vein Thrombosis A deep vein thrombosis (DVT) is a blood clot that develops in the deep, larger veins of the leg, arm, or pelvis. These are more dangerous than clots that might form in veins near the surface of the body. A DVT can lead to serious and even life-threatening complications if the clot breaks off and travels in the bloodstream to the lungs.  A DVT can damage the valves in your leg veins so that instead of flowing upward, the blood pools in the lower leg. This is called post-thrombotic syndrome, and it can result in pain, swelling, discoloration, and sores on the leg. CAUSES Usually, several things contribute to the formation of blood clots. Contributing factors include:  The flow of blood slows down.  The inside of the vein is damaged in some way.  You have a condition that makes blood clot more easily. RISK FACTORS Some people are more likely than others to develop blood clots. Risk factors include:   Smoking.  Being overweight (obese).  Sitting or lying still for a long time. This includes long-distance travel, paralysis, or recovery from an illness or surgery. Other factors that increase risk are:   Older age, especially over 24 years of age.  Having a family history of blood clots or if you have already had a blot clot.  Having major or lengthy surgery. This is especially true for surgery on the hip, knee, or belly (abdomen). Hip surgery is particularly high risk.  Having a long, thin tube (catheter) placed inside a vein during a medical procedure.  Breaking a hip or leg.  Having cancer or cancer treatment.  Pregnancy and childbirth.  Hormone changes make the blood clot more easily during pregnancy.  The fetus puts pressure on the veins of the pelvis.  There is a risk of injury to veins during delivery or a caesarean delivery. The risk is highest just after childbirth.  Medicines containing the female hormone estrogen. This includes birth control pills and  hormone replacement therapy.  Other circulation or heart problems.  SIGNS AND SYMPTOMS When a clot forms, it can either partially or totally block the blood flow in that vein. Symptoms of a DVT can include:  Swelling of the leg or arm, especially if one side is much worse.  Warmth and redness of the leg or arm, especially if one side is much worse.  Pain in an arm or leg. If the clot is in the leg, symptoms may be more noticeable or worse when standing or walking. The symptoms of a DVT that has traveled to the lungs (pulmonary embolism, PE) usually start suddenly and include:  Shortness of breath.  Coughing.  Coughing up blood or blood-tinged mucus.  Chest pain. The chest pain is often worse with deep breaths.  Rapid heartbeat. Anyone with these symptoms should get emergency medical treatment right away. Do not wait to see if the symptoms will go away. Call your local emergency services (911 in the [place] if you have these symptoms. Do not drive yourself to the hospital. DIAGNOSIS If a DVT is suspected, your health care provider will take a full medical history and perform a physical exam. Tests that also may be required include:  Blood tests, including studies of the clotting properties of the blood.  Ultrasound to see if you have clots in your legs or lungs.  X-rays to show the flow of blood when dye is injected into the veins (venogram).  Studies of your lungs if you have any  chest symptoms. PREVENTION  Exercise the legs regularly. Take a brisk 30-minute walk every day.  Maintain a weight that is appropriate for your height.  Avoid sitting or lying in bed for long periods of time without moving your legs.  Women, particularly those over the age of 35 years, should consider the risks and benefits of taking estrogen medicines, including birth control pills.  Do not smoke, especially if you take estrogen medicines.  Long-distance travel can increase your risk of DVT. You  should exercise your legs by walking or pumping the muscles every hour.  Many of the risk factors above relate to situations that exist with hospitalization, either for illness, injury, or elective surgery. Prevention may include medical and nonmedical measures.  Your health care provider will assess you for the need for venous thromboembolism prevention when you are admitted to the hospital. If you are having surgery, your surgeon will assess you the day of or day after surgery. TREATMENT Once identified, a DVT can be treated. It can also be prevented in some circumstances. Once you have had a DVT, you may be at increased risk for a DVT in the future. The most common treatment for DVT is blood-thinning (anticoagulant) medicine, which reduces the blood's tendency to clot. Anticoagulants can stop new blood clots from forming and stop old clots from growing. They cannot dissolve existing clots. Your body does this by itself over time. Anticoagulants can be given by mouth, through an IV tube, or by injection. Your health care provider will determine the best program for you. Other medicines or treatments that may be used are:  Heparin or related medicines (low molecular weight heparin) are often the first treatment for a blood clot. They act quickly. However, they cannot be taken orally and must be given either in shot form or by IV tube.  Heparin can cause a fall in a component of blood that stops bleeding and forms blood clots (platelets). You will be monitored with blood tests to be sure this does not occur.  Warfarin is an anticoagulant that can be swallowed. It takes a few days to start working, so usually heparin or related medicines are used in combination. Once warfarin is working, heparin is usually stopped.  Factor Xa inhibitor medicines, such as rivaroxaban and apixaban, also reduce blood clotting. These medicines are taken orally and can often be used without heparin or related  medicines.  Less commonly, clot dissolving drugs (thrombolytics) are used to dissolve a DVT. They carry a high risk of bleeding, so they are used mainly in severe cases where your life or a part of your body is threatened.  Very rarely, a blood clot in the leg needs to be removed surgically.  If you are unable to take anticoagulants, your health care provider may arrange for you to have a filter placed in a main vein in your abdomen. This filter prevents clots from traveling to your lungs. HOME CARE INSTRUCTIONS  Take all medicines as directed by your health care provider.  Learn as much as you can about DVT.  Wear a medical alert bracelet or carry a medical alert card.  Ask your health care provider how soon you can go back to normal activities. It is important to stay active to prevent blood clots. If you are on anticoagulant medicine, avoid contact sports.  It is very important to exercise. This is especially important while traveling, sitting, or standing for long periods of time. Exercise your legs by walking or by  tightening and relaxing your leg muscles regularly. Take frequent walks. °· You may need to wear compression stockings. These are tight elastic stockings that apply pressure to the lower legs. This pressure can help keep the blood in the legs from clotting. °Taking Warfarin °Warfarin is a daily medicine that is taken by mouth. Your health care provider will advise you on the length of treatment (usually 3-6 months, sometimes lifelong). If you take warfarin: °· Understand how to take warfarin and foods that can affect how warfarin works in your body. °· Too much and too little warfarin are both dangerous. Too much warfarin increases the risk of bleeding. Too little warfarin continues to allow the risk for blood clots. °Warfarin and Regular Blood Testing °While taking warfarin, you will need to have regular blood tests to measure your blood clotting time. These blood tests usually  include both the prothrombin time (PT) and international normalized ratio (INR) tests. The PT and INR results allow your health care provider to adjust your dose of warfarin. It is very important that you have your PT and INR tested as often as directed by your health care provider.    °Warfarin and Your Diet °Avoid major changes in your diet, or notify your health care provider before changing your diet. Arrange a visit with a registered dietitian to answer your questions. Many foods, especially foods high in vitamin K, can interfere with warfarin and affect the PT and INR results. You should eat a consistent amount of foods high in vitamin K. Foods high in vitamin K include:  °· Spinach, kale, broccoli, cabbage, collard and turnip greens, Brussels sprouts, peas, cauliflower, seaweed, and parsley. °· Beef and pork liver. °· Green tea. °· Soybean oil. °Warfarin with Other Medicines °Many medicines can interfere with warfarin and affect the PT and INR results. You must: °· Tell your health care provider about any and all medicines, vitamins, and supplements you take, including aspirin and other over-the-counter anti-inflammatory medicines. Be especially cautious with aspirin and anti-inflammatory medicines. Ask your health care provider before taking these. °· Do not take or discontinue any prescribed or over-the-counter medicine except on the advice of your health care provider or pharmacist. °Warfarin Side Effects °Warfarin can have side effects, such as easy bruising and difficulty stopping bleeding. Ask your health care provider or pharmacist about other side effects of warfarin. You will need to: °· Hold pressure over cuts for longer than usual. °· Notify your dentist and other health care providers that you are taking warfarin before you undergo any procedures where bleeding may occur. °Warfarin with Alcohol and Tobacco  °· Drinking alcohol frequently can increase the effect of warfarin, leading to excess  bleeding. It is best to avoid alcoholic drinks or to consume only very small amounts while taking warfarin. Notify your health care provider if you change your alcohol intake.   °· Do not use any tobacco products including cigarettes, chewing tobacco, or electronic cigarettes. If you smoke, quit. Ask your health care provider for help with quitting smoking. °Alternative Medicines to Warfarin: Factor Xa Inhibitor Medicines °· These blood-thinning medicines are taken by mouth, usually for several weeks or longer. It is important to take the medicine every single day at the same time each day. °· There are no regular blood tests required when using these medicines. °· There are fewer food and drug interactions than with warfarin. °· The side effects of this class of medicine are similar to those of warfarin, including excessive bruising or bleeding. Ask your   health care provider or pharmacist about other potential side effects. SEEK MEDICAL CARE IF:  You notice a rapid heartbeat.  You feel weaker or more tired than usual.  You feel faint.  You notice increased bruising.  You feel your symptoms are not getting better in the time expected.  You believe you are having side effects of medicine. SEEK IMMEDIATE MEDICAL CARE IF:  You have chest pain.  You have trouble breathing.  You have new or increased swelling or pain in one leg.  You cough up blood.  You notice blood in vomit, in a bowel movement, or in urine. MAKE SURE YOU:  Understand these instructions.  Will watch your condition.  Will get help right away if you are not doing well or get worse. Document Released: 09/18/2005 Document Revised: 02/02/2014 Document Reviewed: 05/26/2013 [facility] Patient Information 2015 [place], [place]. This information is not intended to replace advice given to you by your health care provider. Make sure you discuss any questions you have with your health care provider.  Phlebitis Phlebitis is soreness  and swelling (inflammation) of a vein. This can occur in your arms, legs, or torso (trunk), as well as deeper inside your body. Phlebitis is usually not serious when it occurs close to the surface of the body. However, it can cause serious problems when it occurs in a vein deeper inside the body. CAUSES  Phlebitis can be triggered by various things, including:   Reduced blood flow through your veins. This can happen with:  Bed rest over a long period.  Long-distance travel.  Injury.  Surgery.  Being overweight (obese) or pregnant.  Having an IV tube put in the vein and getting certain medicines through the vein.  Cancer and cancer treatment.  Use of illegal drugs taken through the vein.  Inflammatory diseases.  Inherited (genetic) diseases that increase the risk of blood clots.  Hormone therapy, such as birth control pills. SIGNS AND SYMPTOMS   Red, tender, swollen, and painful area on your skin. Usually, the area will be long and narrow.  Firmness along the center of the affected area. This can indicate that a blood clot has formed.  Low-grade fever. DIAGNOSIS  A health care provider can usually diagnose phlebitis by examining the affected area and asking about your symptoms. To check for infection or blood clots, your health care provider may order blood tests or an ultrasound exam of the area. Blood tests and your family history may also indicate if you have an underlying genetic disease that causes blood clots. Occasionally, a piece of tissue is taken from the body (biopsy sample) if an unusual cause of phlebitis is suspected. TREATMENT  Treatment will vary depending on the severity of the condition and the area of the body affected. Treatment may include:  Use of a warm compress or heating pad.  Use of compression stockings or bandages.  Anti-inflammatory medicines.  Removal of any IV tube that may be causing the problem.  Medicines that kill germs (antibiotics) if  an infection is present.  Blood-thinning medicines if a blood clot is suspected or present.  In rare cases, surgery may be needed to remove damaged sections of vein. HOME CARE INSTRUCTIONS   Only take over-the-counter or prescription medicines as directed by your health care provider. Take all medicines exactly as prescribed.  Raise (elevate) the affected area above the level of your heart as directed by your health care provider.  Apply a warm compress or heating pad to the affected  area as directed by your health care provider. Do not sleep with the heating pad.  Use compression stockings or bandages as directed. These will speed healing and prevent the condition from coming back.  If you are on blood thinners:  Get follow-up blood tests as directed by your health care provider.  Check with your health care provider before using any new medicines.  Carry a medical alert card or wear your medical alert jewelry to show that you are on blood thinners.  For phlebitis in the legs:  Avoid prolonged standing or bed rest.  Keep your legs moving. Raise your legs when sitting or lying.  Do not smoke.  Women, particularly those over the age of 53, should consider the risks and benefits of taking the contraceptive pill. This kind of hormone treatment can increase your risk for blood clots.  Follow up with your health care provider as directed. SEEK MEDICAL CARE IF:   You have unusual bruising or any bleeding problems.  Your swelling or pain in the affected area is not improving.  You are on anti-inflammatory medicine, and you develop belly (abdominal) pain. SEEK IMMEDIATE MEDICAL CARE IF:   You have a sudden onset of chest pain or difficulty breathing.  You have a fever or persistent symptoms for more than 2-3 days.  You have a fever and your symptoms suddenly get worse. MAKE SURE YOU:  Understand these instructions.  Will watch your condition.  Will get help right away if  you are not doing well or get worse. Document Released: 09/12/2001 Document Revised: 07/09/2013 Document Reviewed: 05/26/2013 [facility] Patient Information 2015 [place], [place]. This information is not intended to replace advice given to you by your health care provider. Make sure you discuss any questions you have with your health care provider.

## 2014-08-19 NOTE — ED Notes (Signed)
Pt reports having left leg pain and swelling x 2-3 days. Hx of neck surgery on Friday. Went for vascular and +dvt left lower leg and superficial thrombosis left wrist at past IV site.

## 2014-08-19 NOTE — Progress Notes (Signed)
CARE MANAGEMENT ED NOTE 08/19/2014  Patient:  Robyn Green,Robyn Green   Account Number:  [id number]  Date Initiated:  08/19/2014  Documentation initiated by:  [facility] HospitalROGERS,Monika Chestang  Subjective/Objective Assessment:     Subjective/Objective Assessment Detail:   Patient to the ER with complaints of left calf swelling for 3 days. She had  "a 2 level anterior decompression arthrodesis at C5-6 and C6-C7." done on 08/14/2014. Since this time she has been able to control her neck pain but became concerned when the leg swelling started.     Action/Plan:   [employer]amp[employer]eralto 30 day free trial Card   Action/Plan Detail:   ED CM received referral from T. Neva SeatGreene PA-C regarding starting patient on Xarelto with 30 day free trial start pack. Discussed with how to redeem  prescription with card. patient verbalizes understanding teach back done. Patient has [employer]  an   Anticipated DC Date:  08/19/2014     Status Recommendation to Physician:   Result of Recommendation:  Agreed    DC Planning Services  CM consult    Choice offered to / List presented to:  C-1 Patient          Status of service:  Completed, signed off  ED Comments:   ED Comments Detail:  ED CM received referral from T. Neva SeatGreene PA-C regarding starting patient on Xarelto with 30 day free trial start pack. Provided savings  card and explained how to redeem medication with [employer] HirschfeldX[employer] savings  card. patient verbalizes understanding teach back done. Patient will f/u with PCP. Updated Dinah Beers. Greene PA- C. No further CM needs identified

## 2014-08-19 NOTE — ED Provider Notes (Signed)
CSN: [id number]     Arrival date & time 08/19/14  1746 History   First MD Initiated Contact with Patient 08/19/14 1917     Chief Complaint  Patient presents with  . Leg Pain     (Consider location/radiation/quality/duration/timing/severity/associated sxs/prior Treatment) HPI   Patient to the ER with complaints of left calf swelling for 3 days. She had  "a 2 level anterior decompression arthrodesis at C5-6 and C6-C7." done on 08/14/2014. Since this time she has been able to control her neck pain but became concerned when the leg swelling started. She has had no chest pain or SOB. She has not had fevers, nausea, vomiting, diarrhea, weakness, headaches, confusion, hematuria. She is otherwise feeling well.   Dr. Ellene Route had ordered doppler study which returned positive for left lower extremity DVT.   Past Medical History  Diagnosis Date  . Abnormal x-ray of thoracic spine 01/28/2014    MRI thoracic spine 01/20/14: Diffuse abnormal bone marrow signal "this is non-specific and could reflect benign or malignant etiology"  . Leukocytosis, unspecified 02/09/2014  . 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 No pertinent past surgical history. History reviewed. No pertinent family history. History  Substance Use Topics  . Smoking status: Former Smoker    Quit date: 12/01/2013  . Smokeless tobacco: Not on file  . Alcohol Use: No   OB History    No data available     Review of Systems 10 Systems reviewed and are negative for acute change except as noted in the HPI.      Allergies  Bee venom; Codeine; and Niacin and related  Home Medications   Prior to Admission medications   Medication Sig Start Date End Date Taking? Authorizing Provider  AMITRIPTYLINE HCL PO Take 1 tablet by mouth daily.   Yes Historical Provider, MD  diazepam (VALIUM) 5 MG tablet Take 1 tablet (5 mg total) by mouth every 6 (six) hours as needed for anxiety. 08/14/14  Yes Kristeen Miss, MD  folic acid (FOLVITE) 1 MG tablet Take 1 mg by mouth daily.   Yes Historical Provider, MD  ibuprofen (ADVIL,MOTRIN) 200 MG tablet Take 200 mg by mouth every 6 (six) hours as needed for mild pain or moderate pain.   Yes Historical Provider, MD  LISINOPRIL PO Take 1 tablet by mouth daily.   Yes Historical Provider, MD  Multiple Vitamins-Minerals (MULTIVITAMIN WITH MINERALS) tablet Take 1 tablet by mouth daily.   Yes Historical Provider, MD  oxyCODONE-acetaminophen (ROXICET) 5-325 MG per tablet Take 1-2 tablets by mouth every 4 (four) hours as needed for moderate pain or severe pain. 08/15/14  Yes Erline Levine, MD  cyclobenzaprine (FLEXERIL) 10 MG tablet Take 1 tablet (10 mg total) by mouth 3 (three) times daily as needed for muscle spasms. Patient not taking: Reported on 08/19/2014 08/15/14   Erline Levine, MD  HYDROcodone-acetaminophen [facility]) 5-325 MG per tablet Take 1 tablet by mouth every 6 (six) hours as needed for moderate pain. 08/14/14   Kristeen Miss, MD  XARELTO STARTER PACK 15 & 20 MG TBPK Take 15-20 mg by mouth as directed. Take as directed on package: Start with one $Remove'15mg'dgUKVAi$  tablet by mouth twice a day with food. On Day 22, switch to one $Remo'20mg'hWXjR$  tablet once a day with food. 08/19/14   Virl Coble Marilu Favre, PA-C   BP 130/77 mmHg  Pulse 80  Temp(Src) 98.2 F (36.8 C) (Oral)  Resp 20  SpO2 98% Physical Exam  Constitutional: She appears  well-developed and well-nourished. No distress.  HENT:  Head: Normocephalic and atraumatic.  Eyes: Pupils are equal, round, and reactive to light.  Neck: Normal range of motion. Neck supple.  Cardiovascular: Normal rate and regular rhythm.   Pulmonary/Chest: Effort normal and breath sounds normal. She exhibits no tenderness, no bony tenderness and no edema.  Abdominal: Soft.  Musculoskeletal:  superificial swelling to posterior left hand. No induration or erythema present. Pulses are strong and sensations/ROMS are intact.  Left calf show mild  asymmetry to swelling L > R.  Tenderness to palpation of proximal calf. Strong pulses. No redness or induration.  Neurological: She is alert.  Skin: Skin is warm and dry.  Nursing note and vitals reviewed.   ED Course  Procedures (including critical care time) Labs Review Labs Reviewed  CBC WITH DIFFERENTIAL - Abnormal; Notable for the following:    WBC 10.7 (*)    RBC 3.77 (*)    HCT 35.4 (*)    MCH 34.5 (*)    MCHC 36.7 (*)    RDW 16.7 (*)    Platelets 148 (*)    Lymphs Abs 4.2 (*)    All other components within normal limits  COMPREHENSIVE METABOLIC PANEL - Abnormal; Notable for the following:    Glucose, Bld 177 (*)    Total Bilirubin 1.6 (*)    All other components within normal limits  PROTIME-INR    Imaging Review No results found.   EKG Interpretation None      MDM   Final diagnoses:  DVT (deep venous thrombosis), left  Superficial thrombophlebitis    Pharmacy has given pt started [employer] and patient education. PCP can follow-up and manage medications after 30 day trial is completed. She is not having any chest pain or SOB, no clinical signs or VS changes to suggest PE. I explained to pt the risks of this and also that if she has a fall with head injury she has increased risk of intracranial bleeding.  53 y.o.Robyn Green's evaluation in the Emergency Department is complete. It has been determined that no acute conditions requiring further emergency intervention are present at this time. The patient/guardian have been advised of the diagnosis and plan. We have discussed signs and symptoms that warrant return to the ED, such as changes or worsening in symptoms.  Vital signs are stable at discharge. Filed Vitals:   08/19/14 1930  BP: 130/77  Pulse: 80  Temp:   Resp:     Patient/guardian has voiced understanding and agreed to follow-up with the PCP or specialist.     Linus Mako, PA-C 08/19/14 2019  Dot Lanes, MD 08/26/14 2143

## 2015-02-02 ENCOUNTER — Encounter: Payer: Self-pay | Admitting: *Deleted

## 2015-02-03 ENCOUNTER — Ambulatory Visit (INDEPENDENT_AMBULATORY_CARE_PROVIDER_SITE_OTHER): Payer: 59 | Admitting: Neurology

## 2015-02-03 ENCOUNTER — Encounter: Payer: Self-pay | Admitting: Neurology

## 2015-02-03 VITALS — BP 151/81 | HR 68 | Ht 67.0 in | Wt 201.2 lb

## 2015-02-03 DIAGNOSIS — M5440 Lumbago with sciatica, unspecified side: Secondary | ICD-10-CM | POA: Diagnosis not present

## 2015-02-03 DIAGNOSIS — M542 Cervicalgia: Secondary | ICD-10-CM | POA: Diagnosis not present

## 2015-02-03 DIAGNOSIS — M545 Low back pain, unspecified: Secondary | ICD-10-CM | POA: Insufficient documentation

## 2015-02-03 DIAGNOSIS — Z86718 Personal history of other venous thrombosis and embolism: Secondary | ICD-10-CM

## 2015-02-03 NOTE — Patient Instructions (Signed)
-   will do MRI lumbar  - will check nerve conduction study - will refer to outpt PT for maneuver training. - continue to follow up with PCP and Dr. Danielle DessElsner - avoid certain position to trigger the pain - consider relatively less aggressive activity to allow the back pain to heal - follow up in 2 months.

## 2015-02-04 DIAGNOSIS — Z86718 Personal history of other venous thrombosis and embolism: Secondary | ICD-10-CM | POA: Insufficient documentation

## 2015-02-04 DIAGNOSIS — M542 Cervicalgia: Secondary | ICD-10-CM | POA: Insufficient documentation

## 2015-02-04 NOTE — Progress Notes (Addendum)
NEUROLOGY CLINIC NEW PATIENT NOTE  NAME: Robyn Green DOB: 04/16/1961 REFERRING PHYSICIAN: Leonard Downing, *  I saw Robyn Green as a new consult in the neurovascular clinic today regarding  Chief Complaint  Patient presents with  . Back Pain    new consult, rm 1  .  HPI: Robyn Green is a 54 y.o. female with PMH of lower back pain, neck pain s/p surgery, DVT post surgery, borderline diabetes, hereditary enlarged spleen who presents as a new patient for Lower back pain.   Patient stated that around 11/2013 she started to have lower back pain at work. She went to see PCP and scheduling to have MRI T/L done. During the waiting period, in 12/2013, she was bending over and suddenly felt something pop out in the middle of her thoracic spine, and felt excruciating knife stubbing like pain. She had urgent MRI T/L spine showed diffusely abnormal bone marrow nonspecific signal but minimal lumbar disc degeneration, no spinal stenosis or convincing neural impingement occurs, mild spinal stenosis at T2-3, and T7-8. She was then followed up with Dr. Beryle Beams for bone marrow abnormalities, ruled out multiple myeloma and diagnosed with hereditary spherocytosis with enlarged spleen but do not think this is related to her back pain.   Subsequently, she was seen by Dr. Ellene Route in 06/2014 and had C-spine MRI showed spinal stenosis at C4-5 and C6-7 with spinal cord mass effect at C6-7. She had 2 level anterior decompression arthrodesis at C5-6 and C6-C7 with Dr. Ellene Route in 08/2014. However, she developed left DVT post surgery and she was on anticoagulation for 6 months. After the surgery, the neck pain and mid thoracic pain gradually getting better and now not bothering her that much. However, the lower back pain continues to be a problem.  She stated that she is not able to sit more than one hour due to pain at lower back just above the buttock and radiating to both hips. Standing makes the pain  better, but walking on uneven ground, raking, sweeping, bending over makes the pain worse with sharp pain at the lower back. Sometimes, the pain going down her legs, R>L, to the posterior thigh and buttock area. She has good days and bad days, but bad weather makes the pain worse. The pain is same throughout the day. In some occasions, every 1-3 months, she may have bowel or bladder incontinence and sometimes not feeling the bottom when wiping her bottom. She went to see PCP and referred here for further evaluation.  She has borderline DM and A1C was once high but after diet control, now A1C normal.   No significant FH, denies smoking, drinking or illicit drugs.  Past Medical History  Diagnosis Date  . Abnormal x-ray of thoracic spine 01/28/2014    MRI thoracic spine 01/20/14: Diffuse abnormal bone marrow signal "this is non-specific and could reflect benign or malignant etiology"  . Leukocytosis, unspecified 02/09/2014  . 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 Hereditary spherocytosis 03/03/2014  . Gall stone 1995  . Diabetes mellitus without complication     11/06/98 completly diet controlled  . Urinary incontinence in female     "occasionally"  . Bowel incontinence     "Occasionally"  . Fatigue    Past Surgical History  Procedure Laterality Date  . Choley      lap [employer]  . Cervical spine surgery  08/2014   Family History  Problem Relation Age of Onset  . Kidney Stones Father   .  Arthritis Mother   . Stroke Mother     heat stroke   Current Outpatient Prescriptions  Medication Sig Dispense Refill  . AMITRIPTYLINE HCL PO Take 1 tablet by mouth daily.    . folic acid (FOLVITE) 1 MG tablet Take 1 mg by mouth daily.    Marland Kitchen LISINOPRIL PO Take 1 tablet by mouth daily.    . meloxicam (MOBIC) 15 MG tablet     . methocarbamol (ROBAXIN) 750 MG tablet     . Multiple Vitamins-Minerals (MULTIVITAMIN WITH MINERALS) tablet Take 1 tablet by mouth daily.    Marland Kitchen HYDROcodone-acetaminophen (NORCO) 5-325 MG per tablet  Take 1 tablet by mouth every 6 (six) hours as needed for moderate pain. (Patient not taking: Reported on 02/03/2015) 60 tablet 0   No current facility-administered medications for this visit.   Allergies  Allergen Reactions  . Bee Venom   . Codeine Other (See Comments)    hallucinations  . Niacin And Related Itching and Rash   History   Social History  . Marital Status: Legally Separated    Spouse Name: N/A  . Number of Children: 0  . Years of Education: 16   Occupational History  . laboratory     [employer]   Social History Main Topics  . Smoking status: Former Smoker    Quit date: 12/01/2013  . Smokeless tobacco: Not on file  . Alcohol Use: No  . Drug Use: No  . Sexual Activity: Not on file   Other Topics Concern  . Not on file   Social History Narrative   Single, no children       Review of Systems Full 14 system review of systems performed and notable only for those listed, all others are neg:  Constitutional:  fatigue Cardiovascular:  Ear/Nose/Throat:   Skin:  Eyes:  Occasional blurry vision Respiratory:   Gastroitestinal:  Occasional bowel incontinence Genitourinary: Occasional urinary incontinence Hematology/Lymphatic:   Endocrine:  Musculoskeletal:  Aching muscles Allergy/Immunology:   Neurological:  Numbness, weakness Psychiatric: Decreased energy Sleep: Occasional sleepiness   Physical Exam  Filed Vitals:   02/03/15 0937  BP: 151/81  Pulse: 68    General - Well nourished, well developed, in no apparent distress.  Ophthalmologic - Sharp disc margins OU.  Cardiovascular - Regular rate and rhythm with no murmur. Carotid pulses were 2+ without bruits .   Neck - supple, no nuchal rigidity.  Musculoskeleton - mild tenderness at sacral spine.  Mental Status -  Level of arousal and orientation to time, place, and person were intact. Language including expression, naming, repetition, comprehension, reading, and writing was assessed  and found intact. Attention span and concentration were normal. Recent and remote memory were intact. Fund of Knowledge was assessed and was intact.  Cranial Nerves II - XII - II - Visual field intact OU. III, IV, VI - Extraocular movements intact. V - Facial sensation intact bilaterally. VII - Facial movement intact bilaterally. VIII - Hearing & vestibular intact bilaterally. X - Palate elevates symmetrically. XI - Chin turning & shoulder shrug intact bilaterally. XII - Tongue protrusion intact.  Motor Strength - The patient's strength was normal in all extremities and pronator drift was absent.  Bulk was normal and fasciculations were absent.   Motor Tone - Muscle tone was assessed at the neck and appendages and was normal.  Reflexes - The patient's reflexes were 1+ in bicep and tricep and patellar reflexes but diminished at ankle reflex. extremities and she had no pathological  reflexes.  Sensory - Light touch, temperature/pinprick, vibration and proprioception, and Romberg testing were assessed and were normal.    Coordination - The patient had normal movements in the hands and feet with no ataxia or dysmetria.  Tremor was absent.  Gait and Station - The patient's transfers, posture, gait, station, and turns were observed as normal.  Straight leg test - right-sided negative, left side about 60 with no worsening on dorsiflexion.   Imaging  I have personally reviewed the radiological images below and agree with the radiology interpretations.  MRI T- spine 12/2013 -  1. Diffusely abnormal bone marrow signal, as described in the lumbar spine report from today. This is nonspecific, and could reflect benign or malignant etiology. Query chronic anemia or other benign explanation. 2. Multilevel thoracic degenerative changes, including disc, and the plate, and facet degeneration. Mild multilevel spinal stenosis at T2-T3. Borderline to mild spinal stenosis at T7-T8. Multilevel thoracic  foraminal stenosis, predominantly mild. 3. No thoracic spinal cord signal abnormality  MRI L-spine 12/2013 -  1. Diffusely abnormal bone marrow signal, nonspecific. Benign (red marrow reactivation in the setting of chronic anemia) and malignant (metastatic disease, infiltrative marrow processes) are possible. 2. Minimal lumbar disc degeneration. Multilevel lumbar facet degeneration maximal at L4-L5. No spinal stenosis or convincing neural impingement occurs.  MRI C-spine 06/2014 -   1. 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 Moderate spinal cord mass effect at the latter, but no spinal cord signal abnormality identified. 2. Moderate or severe multifactorial neural foramina stenosis at the bilateral C5, C6, and C7 nerve levels. 3. Continued abnormal bone marrow signal, as described on the thoracic and lumbar comparisons on 01/20/2014.  LE venous doppler 08/2014 - Findings consistent with deep vein thrombosis involving the left popliteal vein and left gastrocnemius vein. - No evidence of deep vein thrombosis involving the right lower extremity.  Lab Review none   Assessment and Plan:   In summary, Melaina L Ulatowski is a 54 y.o. female with PMH of back pain, neck pain s/p cervical surgery, DVT post surgery, borderline diabetes, hereditary enlarged spleen was referred for LBP. By her description, the LBP is typical for lumbosacral radiculopathy. Her last MRI L-spine was in 12/2013 and since then she has progression of LBP and has occasional b/b incontinence and saddle anesthesia, which suggesting for cauda equina syndrome. Will do MRI L-spine repeat, EMG/NCS, and refer to PT. Pt should learn from PT for appropriate positioning and avoid pain triggers.   - will do MRI lumbar spine - will do EMG/NCS - will refer to outpt PT  - continue to follow up with PCP and Dr. Ellene Route - RTC in 2 months.  Thank you very much for the opportunity to  participate in the care of this patient.  Please do not hesitate to call if any questions or concerns arise.  Orders Placed This Encounter  Procedures  . MR Lumbar Spine Wo Contrast    Standing Status: Future     Number of Occurrences:      Standing Expiration Date: 04/04/2016    Order Specific Question:  Reason for Exam (SYMPTOM  OR DIAGNOSIS REQUIRED)    Answer:  LBP with bowel and bladder incontinence and saddle parathesia    Order Specific Question:  Preferred imaging location?    Answer:  Internal    Order Specific Question:  Does the patient have a pacemaker or implanted devices?    Answer:  No    Order Specific  Question:  What is the patient's sedation requirement?    Answer:  No Sedation  . Ambulatory referral to Physical Therapy    Referral Priority:  Routine    Referral Type:  Physical Medicine    Referral Reason:  Specialty Services Required    Requested Specialty:  Physical Therapy    Number of Visits Requested:  1  . NCV with EMG(electromyography)    LBP to rule out radiculopathy    Standing Status: Future     Number of Occurrences:      Standing Expiration Date: 02/03/2016    Order Specific Question:  Where should this test be performed?    Answer:  [facility]    Meds ordered this encounter  Medications  . methocarbamol (ROBAXIN) 750 MG tablet    Sig:   . meloxicam (MOBIC) 15 MG tablet    Sig:     Patient Instructions  - will do MRI lumbar  - will check nerve conduction study - will refer to outpt PT for maneuver training. - continue to follow up with PCP and Dr. Ellene Route - avoid certain position to trigger the pain - consider relatively less aggressive activity to allow the back pain to heal - follow up in 2 months.   Rosalin Hawking, MD PhD [facility] [address] [place], [place] [address] [phone]

## 2015-02-16 ENCOUNTER — Ambulatory Visit (INDEPENDENT_AMBULATORY_CARE_PROVIDER_SITE_OTHER): Payer: 59 | Admitting: Neurology

## 2015-02-16 ENCOUNTER — Ambulatory Visit (INDEPENDENT_AMBULATORY_CARE_PROVIDER_SITE_OTHER): Payer: Self-pay | Admitting: Neurology

## 2015-02-16 DIAGNOSIS — M5440 Lumbago with sciatica, unspecified side: Secondary | ICD-10-CM

## 2015-02-16 DIAGNOSIS — M545 Low back pain, unspecified: Secondary | ICD-10-CM

## 2015-02-16 DIAGNOSIS — Z0289 Encounter for other administrative examinations: Secondary | ICD-10-CM

## 2015-02-16 NOTE — Progress Notes (Signed)
See procedure note.

## 2015-02-16 NOTE — Progress Notes (Signed)
  [facility]    Provider:  Dr Lucia GaskinsAhern Referring Provider: Kaleen MaskElkins, Wilson Oliver, * Primary Care Physician:  Kaleen MaskELKINS,WILSON OLIVER, MD   HPI:  Robyn Green is a 54 y.o. female here as a referral from Dr. Jeannetta NapElkins for bilateral low back pain with radicular symptoms. She has numbness in the buttocks. Pain radiates posteriorly into the right thigh. She reports leg cramps. Left leg has occasional radicular symptoms but not as symptomatic as the right leg.  Summary  Nerve conduction studies were performed on the bilateral lower extremities:  Bilateral Peroneal motor nerves showed normal conductions with normal F Wave latencies Bilateral Tibial motor nerves showed normal conductions with normal F Wave latencies Bilateral Sural sensory nerves were within normal limits  EMG Needle study was performed on selected right lower extremity muscles:   The right Vastus Medialis, right Anterior Tibialis, right Medial Gastrocnemius, right Extensor Hallucis Longus, right biceps femoris (long head), right gluteus medius, right gluteus maximus muscles were within normal limits. The bilateral S1/L5/L4 paraspinal muscles were within normal limits  Conclusion: This is a normal study. No electrophysiologic evidence for peripheral polyneuropathy or lumbosacral radiculopathy.  Robyn DeanAntonia Wassim Kirksey, MD  [facility] [address] [place], [place] [id number]  Phone [phone] Fax [phone]

## 2015-02-17 ENCOUNTER — Ambulatory Visit (INDEPENDENT_AMBULATORY_CARE_PROVIDER_SITE_OTHER): Payer: 59

## 2015-02-17 DIAGNOSIS — M5440 Lumbago with sciatica, unspecified side: Secondary | ICD-10-CM

## 2015-02-18 ENCOUNTER — Telehealth: Payer: Self-pay

## 2015-02-18 ENCOUNTER — Telehealth: Payer: Self-pay | Admitting: *Deleted

## 2015-02-18 NOTE — Telephone Encounter (Signed)
VM left to let patient know that her nerve conduction study done the other day in our office was normal. Will wait for her L-spine MRI result.  Continue current treatment plan and PT/OT.  Patient informed to call office back to receive results.

## 2015-02-18 NOTE — Telephone Encounter (Signed)
error 

## 2015-02-18 NOTE — Telephone Encounter (Signed)
-----   Message from Hillis RangeEmma L King, RN sent at 02/18/2015  3:30 PM EDT -----   ----- Message -----    From: Elise BenneSamantha Allen, RN    Sent: 02/17/2015   4:46 PM      To: Hillis RangeEmma L King, RN    ----- Message -----    From: Marvel PlanJindong Xu, MD    Sent: 02/17/2015   4:31 PM      To: [facility] Triage Pool  Hello, could you please let the pt know that her nerve conduction study done the other day in our office was normal. Will wait for her L-spine MRI result. Continue current treatment plan and PT/OT. Thanks.  Marvel PlanJindong Xu, MD PhD Stroke Neurology 02/17/2015 4:31 PM

## 2015-02-18 NOTE — Telephone Encounter (Signed)
Pt was informed of normal NCS results, and that she would be informed of MRI results when they are available.  She expressed concern that this MRI would not show anything abnormal; she was informed to receive results first and then make further plans if necessary.  She aggred to this and no other questions at this time.

## 2015-02-19 ENCOUNTER — Telehealth: Payer: Self-pay | Admitting: *Deleted

## 2015-02-19 NOTE — Telephone Encounter (Signed)
Spoke with patient who states she has "tried PT for 2 months with no relief of her back pain". She states she "used to take Aleve but was told to stop". She states she takes Amitriptyline, Robaxin but they are not helpful. She states she rarely takes Hydrocodone "because it isn't fixing the problem." She states that she cannot sleep well, occasionally has pain that "brings her to tears depending on her activity level". She states that she "feels there is something else wrong causing her all this pain". She also noted there have been occassions where she "wet herself", stating she thinks something is swelling and causing pressure on her nerves". She does not want to continue PT. Informed patient Dr Roda ShuttersXu is in hospital, but will route her c/o, concerns to him. She verbalized understanding.

## 2015-02-19 NOTE — Telephone Encounter (Signed)
-----   Message from Marvel PlanJindong Xu, MD sent at 02/18/2015  5:38 PM EDT ----- Robyn MainsHi, Robyn Green:  I tried to call pt and deliver the MRI result to her but she was not available at both her mobile and home phone numbers and the memory was full, can not leave VM. Could you please call her to let her know that her Lumbar MRI showed essentially normal with only minimal to mild spinal stenosis. Currently, physical therapy is the best way to go. I have referred her to PT at last visit. She should get called from PT for appointment. If she has further questions, please let me know. Thanks for your help.  Marvel PlanJindong Xu, MD PhD Stroke Neurology 02/18/2015 5:38 PM

## 2015-02-21 NOTE — Telephone Encounter (Signed)
Called her again today 02/21/15 2:52pm and nobody is available. I left VM to let her know that I will have Dr. Lucia GaskinsAhern to see her too for a second opinion. Dr. Lucia GaskinsAhern did her EMG/NCS so she would be more familiar with her.   [name], if pt called back, please let her know the plan again.   [name], could you please arrange her to see Dr. Lucia GaskinsAhern? Not urgent, thanks. Will cc Dr. Lucia GaskinsAhern too.  [name], MD PhD Stroke Neurology 02/21/2015 2:57 PM

## 2015-02-21 NOTE — Telephone Encounter (Signed)
Dr. Roda ShuttersXu - I am more than happy to see patient in clinic for you.  Kara MeadEmma - please ensure I have 30 minutes. Thank you.

## 2015-02-22 ENCOUNTER — Telehealth: Payer: Self-pay | Admitting: *Deleted

## 2015-02-22 NOTE — Telephone Encounter (Signed)
Thank you, Dr. Lucia GaskinsAhern, for your kind help.  Robyn PlanJindong Maximiano Lott, MD PhD Stroke Neurology 02/22/2015 6:17 AM

## 2015-02-22 NOTE — Telephone Encounter (Signed)
Pt has appt with Dr. Lucia GaskinsAhern tomorrow at 7:30am. Thank you!

## 2015-02-22 NOTE — Telephone Encounter (Signed)
Called patient and made appointment tomorrow at 7:30am with Dr. Howard PouchAher per Dr. Roda ShuttersXu referral. Told pt to arrive 15-2420min before appt time. Pt verbalized understanding.

## 2015-02-23 ENCOUNTER — Encounter: Payer: Self-pay | Admitting: Neurology

## 2015-02-23 ENCOUNTER — Ambulatory Visit (INDEPENDENT_AMBULATORY_CARE_PROVIDER_SITE_OTHER): Payer: 59 | Admitting: Neurology

## 2015-02-23 VITALS — BP 134/79 | HR 73 | Temp 98.1°F | Ht 67.0 in | Wt 201.0 lb

## 2015-02-23 DIAGNOSIS — R159 Full incontinence of feces: Secondary | ICD-10-CM | POA: Diagnosis not present

## 2015-02-23 DIAGNOSIS — M545 Low back pain, unspecified: Secondary | ICD-10-CM

## 2015-02-23 DIAGNOSIS — R32 Unspecified urinary incontinence: Secondary | ICD-10-CM

## 2015-02-23 DIAGNOSIS — R208 Other disturbances of skin sensation: Secondary | ICD-10-CM

## 2015-02-23 DIAGNOSIS — G541 Lumbosacral plexus disorders: Secondary | ICD-10-CM | POA: Insufficient documentation

## 2015-02-23 DIAGNOSIS — R200 Anesthesia of skin: Secondary | ICD-10-CM

## 2015-02-23 NOTE — Patient Instructions (Signed)
Overall you are doing fairly well but I do want to suggest a few things today:   Remember to drink plenty of fluid, eat healthy meals and do not skip any meals. Try to eat protein with a every meal and eat a healthy snack such as fruit or nuts in between meals. Try to keep a regular sleep-wake schedule and try to exercise daily, particularly in the form of walking, 20-30 minutes a day, if you can.   As far as diagnostic testing: MR Pelvis w contrast   My clinical assistant and will answer any of your questions and relay your messages to me and also relay most of my messages to you.   Our phone number is [phone]. We also have an after hours call service for urgent matters and there is a physician on-call for urgent questions. For any emergencies you know to call 911 or go to the nearest emergency room

## 2015-02-23 NOTE — Progress Notes (Signed)
[facility]    Provider:  Dr Jaynee Eagles Referring Provider: Leonard Downing, * Primary Care Physician:  Leonard Downing, MD  CC:  Low back pain, urine and bowel incontinence, groin and anal numbness  HPI: Robyn Green is a 54 y.o. female with PMH of lower back pain, neck pain s/p surgery, DVT post surgery, borderline diabetes, hereditary enlarged spleen and spherocytosis who presents as a follow up patient for Lower back pain.   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 She has numbness in the saddle area and buttocks area.  She has decreased her activity due to the symptoms.  Started around 2014 with low back pain, progressed to significant pain by the end of 2014 not responding to Advil. In 11/2013 she was seeing Dr. Arelia Sneddon and she reached over the lab table and she heard a pop in the middle part of her back and she couldn't stand straight up, felt like a knife through her back. Thoracic and Lumbar spine MRIs  did not show any nerve root impingement. The MRIs did show diffusely abnormal bone marrow and she was followed up with Dr. Jim Desanctis who ruled out multiple myeloma and a diagnosed her with hereditary spherocytosis with enlarged spleen. She had cervical spine surgery due to spinal stenosis at C4-C5 and C6-C7 with spinal cord mass effect at C6-C7. This improved her cervical and thoracic pain but she was also hoping this would help her low back pain however it did not. She currently has LBP with radiation to the hips and sometimes she has tingling in the buttocks and occ.  radiation to the back of the highs. The symptoms are described as tingling. She has aggravating lower back pain but the radiating pain is not painful, just something she noticed. She can't sit in church anymore because her low back hurts. The pain is really in the low back, not significant painful radiculopathy. She has swelling in the lower sacrum area and points to the coccyx area.  She couldn't walk yesterday because it hurts so bad. She can barely sleep. She can't lay on her back. She is having bowel and urinary incontinence. She has had a whole bowel movement in her pants.  She does feel the urge but can't get to the bathroom. She is having less sensation in the anus and groin areas.  Standing makes the pain better, but walking on uneven ground, raking, sweeping, bending over makes the pain worse with sharp pain at the lower back. Weather makes the pain worse. The pain is same throughout the day. She feels like she is sitting on a bubble. Her tailbone has been broken twice.   Reviewed notes, labs and imaging from outside physicians, which showed:  MRI Lumbar spine (personally reviewed images)  IMPRESSION:  Abnormal MRI lumbar spine (without) demonstrating: 1. At L5-S1: disc bulging and facet hypertrophy with mild right and mild-moderate left foraminal stenosis 2. At L4-5: disc bulging and facet hypertrophy with mild biforaminal narrowing  3. Diffusely decreased T1 and T2 marrow signal, may represent active marrow signal related to patient's hereditary spherocytosis.  4. Overall stable compared to MRI on 01/21/04.  EMG/NCS of the bilateral lower extremities was normal  MRI T/L spine showed diffusely abnormal bone marrow nonspecific signal but minimal lumbar disc degeneration, no spinal stenosis or convincing neural impingement, mild spinal stenosis at T2-3, and T7-8. She was then followed up with Dr. Beryle Beams for bone marrow abnormalities, ruled out multiple myeloma and diagnosed  with hereditary spherocytosis with enlarged spleen.  In 2015 she had C-spine MRI showed spinal stenosis at C4-5 and C6-7 with spinal cord mass effect at C6-7. She had 2 level anterior decompression arthrodesis at C5-6 and C6-C7 with Dr. Ellene Route in 08/2014. However, she developed left DVT post surgery and she was on anticoagulation for 6 months.   Review of Systems: Patient complains of  symptoms per HPI as well as the following symptoms:weakness, no CP, no SOB. Pertinent negatives per HPI. All others negative.   History   Social History  . Marital Status: Legally Separated    Spouse Name: N/A  . Number of Children: 0  . Years of Education: 16   Occupational History  . laboratory     [employer]   Social History Main Topics  . Smoking status: Former Smoker    Quit date: 12/01/2013  . Smokeless tobacco: Not on file  . Alcohol Use: No  . Drug Use: No  . Sexual Activity: Not on file   Other Topics Concern  . Not on file   Social History Narrative   Single, no children       Family History  Problem Relation Age of Onset  . Kidney Stones Father   . Arthritis Mother   . Stroke Mother     heat stroke    Past Medical History  Diagnosis Date  . Abnormal x-ray of thoracic spine 01/28/2014    MRI thoracic spine 01/20/14: Diffuse abnormal bone marrow signal "this is non-specific and could reflect benign or malignant etiology"  . Leukocytosis, unspecified 02/09/2014  . 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 Hereditary spherocytosis 03/03/2014  . Gall stone 1995  . Diabetes mellitus without complication     10/09/59 completly diet controlled  . Urinary incontinence in female     "occasionally"  . Bowel incontinence     "Occasionally"  . Fatigue     Past Surgical History  Procedure Laterality Date  . Choley      lap [employer]  . Cervical spine surgery  08/2014    Current Outpatient Prescriptions  Medication Sig Dispense Refill  . AMITRIPTYLINE HCL PO Take 1 tablet by mouth daily.    . folic acid (FOLVITE) 1 MG tablet Take 1 mg by mouth daily.    Marland Kitchen HYDROcodone-acetaminophen (NORCO) 5-325 MG per tablet Take 1 tablet by mouth every 6 (six) hours as needed for moderate pain. (Patient not taking: Reported on 02/03/2015) 60 tablet 0  . LISINOPRIL PO Take 1 tablet by mouth daily.    . meloxicam (MOBIC) 15 MG tablet     . methocarbamol (ROBAXIN) 750 MG tablet     .  Multiple Vitamins-Minerals (MULTIVITAMIN WITH MINERALS) tablet Take 1 tablet by mouth daily.     No current facility-administered medications for this visit.    Allergies as of 02/23/2015 - Review Complete 02/03/2015  Allergen Reaction Noted  . Bee venom  02/09/2014  . Codeine Other (See Comments) 02/09/2014  . Niacin and related Itching and Rash 08/19/2014    Vitals: There were no vitals taken for this visit. Last Weight:  Wt Readings from Last 1 Encounters:  02/16/15 201 lb (91.173 kg)   Last Height:   Ht Readings from Last 1 Encounters:  02/03/15 $RemoveB'5\' 7"'ZbQsxEwd$  (1.702 m)   Physical exam: Exam: Gen: NAD, conversant, well nourised, overweight, well groomed                     CV: RRR, no MRG. No  Carotid Bruits. No peripheral edema, warm, nontender Eyes: Conjunctivae clear without exudates or hemorrhage MSK: Diffuse tenderness to palpation in the lumbosacral area, point tenderness in the buttocks and coccyx.  Neuro: Detailed Neurologic Exam  Speech:    Speech is normal; fluent and spontaneous with normal comprehension.  Cognition:    The patient is oriented to person, place, and time;     recent and remote memory intact;     language fluent;     normal attention, concentration,     fund of knowledge Cranial Nerves:    The pupils are equal, round, and reactive to light. The fundi are normal and spontaneous venous pulsations are present. Visual fields are full to finger confrontation. Extraocular movements are intact. Trigeminal sensation is intact and the muscles of mastication are normal. The face is symmetric. The palate elevates in the midline. Hearing intact. Voice is normal. Shoulder shrug is normal. The tongue has normal motion without fasciculations.   Coordination:    Normal finger to nose and heel to shin. Normal rapid alternating movements.   Gait:    Heel-toe and tandem gait are normal.   Motor Observation:    No asymmetry, no atrophy, and no involuntary movements  noted. Tone:    Normal muscle tone.    Posture:    Posture is normal. normal erect    Strength:    Strength is V/V in the upper and lower limbs.      Sensation: intact to LT. Romberg negative.     Reflex Exam:  DTR's:    Diminished at left ankle even with facilitation. Otherwise symmetrical.  Toes:    The toes are downgoing bilaterally.   Clonus:    Clonus is absent.    Assessment/Plan:  Robyn Green is a 54 y.o. female with PMH of lower back pain, neck pain s/p surgery, DVT post surgery, borderline diabetes, hereditary enlarged spleen and spherocytosis who presents as a new patient for Lower back pain. Neuro exam is unremarkable.  MRI of the lumbar spine is unremarkable, no causative etiology found, and has been stable for many years. Symptoms appear to be arthritic/musculoskeletal in nature and she describes primarily lower back pain with point tenderness in the coccyx, with occasional radiation into the buttocks that is not painful. The pain is significant in the lower back. However the concerning report is of urinary and bowel incontinence, anal and groin anesthesia. Will order MRI of the pelvis to evaluate the roots as they travel to the pelvis and look for any masses or otherwise compressing the sacral roots. Will order BMP. She will follow up after imaging.  Agree with outpatient physical therapy. MRI of the pelvis to evaluate the lumbosacral plexus due to bowel and bladder incontinence, sacral anesthesia.  Sarina Ill, MD [facility] [address] [place],  [address]  Phone [phone] Fax [phone]

## 2015-02-24 ENCOUNTER — Telehealth: Payer: Self-pay

## 2015-02-24 LAB — BASIC METABOLIC PANEL
BUN/Creatinine Ratio: 14 (ref 9–23)
BUN: 10 mg/dL (ref 6–24)
CO2: 23 mmol/L (ref 18–29)
Calcium: 9.4 mg/dL (ref 8.7–10.2)
Chloride: 100 mmol/L (ref 97–108)
Creatinine, Ser: 0.7 mg/dL (ref 0.57–1.00)
GFR calc non Af Amer: 99 mL/min/{1.73_m2} (ref >59)
GFR, EST AFRICAN AMERICAN: 114 mL/min/{1.73_m2} (ref >59)
GLUCOSE: 264 mg/dL — AB (ref 65–99)
Potassium: 3.7 mmol/L (ref 3.5–5.2)
Sodium: 140 mmol/L (ref 134–144)

## 2015-02-24 NOTE — Telephone Encounter (Signed)
VM left to inform patient her glucose was pretty high 264. However she may have just eaten something, this was not fasting. She can follow up with her primary care, she has been asked to call office back to receive results.

## 2015-02-24 NOTE — Telephone Encounter (Signed)
Patient informed of her glucose lab result being 264 and because it was not fasting this could have been the cause, she states she will follow up with her PCP.

## 2015-03-02 ENCOUNTER — Other Ambulatory Visit: Payer: Self-pay | Admitting: Neurology

## 2015-03-04 ENCOUNTER — Telehealth: Payer: Self-pay | Admitting: Neurology

## 2015-03-04 NOTE — Telephone Encounter (Signed)
MRI Pelvis w/contrast needs medical review.  Please call [phone] Case# [id number]

## 2015-03-09 NOTE — Telephone Encounter (Signed)
Case has been approved. [id number]

## 2015-03-11 ENCOUNTER — Telehealth: Payer: Self-pay | Admitting: *Deleted

## 2015-03-11 ENCOUNTER — Ambulatory Visit
Admission: RE | Admit: 2015-03-11 | Discharge: 2015-03-11 | Disposition: A | Discharge status: Home / Self Care | Payer: 59 | Source: Ambulatory Visit | Attending: Neurology | Admitting: Neurology

## 2015-03-11 DIAGNOSIS — M545 Low back pain, unspecified: Secondary | ICD-10-CM

## 2015-03-11 DIAGNOSIS — R159 Full incontinence of feces: Secondary | ICD-10-CM

## 2015-03-11 DIAGNOSIS — R200 Anesthesia of skin: Secondary | ICD-10-CM

## 2015-03-11 DIAGNOSIS — R32 Unspecified urinary incontinence: Principal | ICD-10-CM

## 2015-03-11 DIAGNOSIS — G541 Lumbosacral plexus disorders: Secondary | ICD-10-CM

## 2015-03-11 MED ORDER — GADOBENATE DIMEGLUMINE 529 MG/ML IV SOLN
18.0000 mL | Freq: Once | INTRAVENOUS | Status: AC | PRN
  Administered 2015-03-11: 18 mL via INTRAVENOUS

## 2015-03-11 NOTE — Telephone Encounter (Signed)
Left VM for pt to let her know I was calling about results. Gave her [facility] phone number and office hours and that Dr. Lucia Gaskins and I are not in the office on Fridays.

## 2015-03-12 NOTE — Telephone Encounter (Signed)
Patient is returning a call. °

## 2015-03-15 NOTE — Telephone Encounter (Signed)
Spoke with pt about MRI results. Told her there was no nerve impingement and no reason for the symptoms she is having. Pt verbalized understanding.   She stated she is still having symptoms where she is having pain in her buttock area and is having episodes of incontinence of both bowel and urinary. She is only taking tylenol for pain and has not scheduled for physical therapy yet. I gave her [phone] to call and schedule her physical therapy. She stated this has not helped in the past and believes it will not do anything for her now but will call to get that scheduled.   I told her I would let Dr. Lucia Gaskins know about her symptoms and we would call her back. Pt verbalized understanding.

## 2015-03-15 NOTE — Telephone Encounter (Signed)
Please let patient know she should follow up with primary care. Extensive imaging and emg/ncs has failed to reveal any nerve-root impingement. Thanks

## 2015-03-16 NOTE — Telephone Encounter (Signed)
Spoke with pt to let her know she should f/u with primary care. The extensive imaging and emg/ncs has not showed any nerve-root impingement. Told her to f/u with physical therapy. Told her to call back if she has any more questions. Pt verbalized understanding.

## 2015-03-16 NOTE — Telephone Encounter (Signed)
Thank you :)

## 2015-05-27 DIAGNOSIS — Z0271 Encounter for disability determination: Secondary | ICD-10-CM

## 2015-06-14 ENCOUNTER — Telehealth: Payer: Self-pay | Admitting: Neurology

## 2015-06-14 NOTE — Telephone Encounter (Signed)
I called patient back. The mailbox was full. The treatment for piriformis syndrome is use of  nonsteroidal anti-inflammatory drugs (NSAIDs), muscle relaxants, ice, and rest. Physical therapy can help as well for stretching exercises and techniques.  We can refer her directly to physical therapy for this treatment.   Robyn Green - can you try calling patient back and discussing with her? I tried calling and her mailbox was full. Thanks.

## 2015-06-14 NOTE — Telephone Encounter (Signed)
Patient called stating Dr Jeannetta Nap thinks she may have Piriformis Syndrome and he suggested she call to schedule and appointment. He gave her dose of steroid orally and muscle relaxers. She said this has helped the most. She has been tapering off and yesterday was the last day. She is requesting to schedule appt. Last OV from Dr Lucia Gaskins was to follow up with PCP. Please call and advise. Patient can be reached at [phone].

## 2015-06-15 NOTE — Telephone Encounter (Signed)
Called and spoke w/ pt. Advised that Dr. Lucia Gaskins tried calling yesterday but her mailbox was full. Treatment for piriformis syndrome is use of NSAIDS (non-steroidal anti-inflammatory drugs), muscle relaxers, ice and rest. PT can help for stretching and techniques. Dr. Lucia Gaskins can refer he to PT for this treatment. She verbalized understanding but stated she has already gone to physical therapy twice and continues to do exercises currently and YOGA. She cannot take NSAIDS (ibuprofen). She used to take this but was told she cannot anymore. Dr Jeannetta Nap told her he cannot do steroid injections, that she has to see a specialist for this. He told her surgery might be an option as well per pt. When she was given steroids before, it was the only thing that brought her pain down below a 5/10 and her pain now is at a constant "5-8/10" on the pain scale. She understands the risk of long-term steroid use. She has had this problem for 2 years now and has had several MRI's that do not show anything. She states "they fixed my upper back hoping it would fix my lower back, but it did not". She fell 2 times last week because she thinks there is something pressing on a nerve and all her feeling goes away. She "wees/poos" on her self per patient. She wants to know what else can be done besides PT. I told her I will ask Dr. Lucia Gaskins and we will call her back. She verbalized understanding.

## 2015-06-16 ENCOUNTER — Other Ambulatory Visit: Payer: Self-pay | Admitting: Neurology

## 2015-06-16 DIAGNOSIS — G5700 Lesion of sciatic nerve, unspecified lower limb: Secondary | ICD-10-CM

## 2015-06-16 NOTE — Telephone Encounter (Signed)
Robyn Green - you will see a referral to Pain clinic for this patient. Please refer to:   [facility]  Pain Management Physician  [address] [place], [place] [address]   [phone]   [name] - let her know I am referring her to a pain clinic where they do nerve blocks and steroid injections thanks

## 2015-06-16 NOTE — Telephone Encounter (Signed)
Called and spoke w/ pt to let her know Dr. Lucia Gaskins put in referral to pain clinic and out referral coordinator will be sending over referral. Can take up to a week for her to be scheduled or longer. Gave her name of place and phone number of where referral is being sent. Pt verbalized understanding.

## 2015-06-22 NOTE — Telephone Encounter (Signed)
Referral sent to [facility].

## 2016-04-15 IMAGING — MR MR PELVIS WO/W CM
4 of 10 series · 18 of 48 positions shown · IV contrast (multihance)
Comparison: 11/25/2012

CLINICAL DATA: Chronic low back pain radiating into the buttocks,
hips, and thighs, with numbness and tingling. Occasional
incontinence and saddle anesthesia.

EXAM:
MRI PELVIS WITHOUT AND WITH CONTRAST
TECHNIQUE: Multiplanar multisequence MR imaging of the pelvis was performed
both before and after administration of intravenous contrast.
CONTRAST:  18mL MULTIHANCE GADOBENATE DIMEGLUMINE 529 MG/ML IV SOLN

[Series 4: T1 · coronal · 5.0mm · 1.33mm/px · 4 of 24 slices shown (1 of 2)]
[im 1/24]
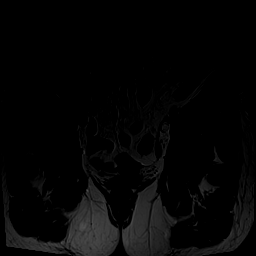
[im 8/24]
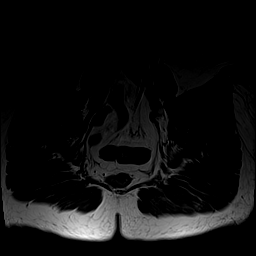
[im 16/24]
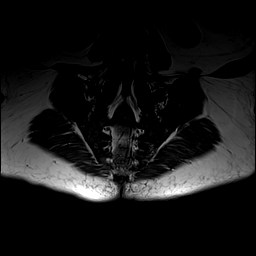
[im 24/24]
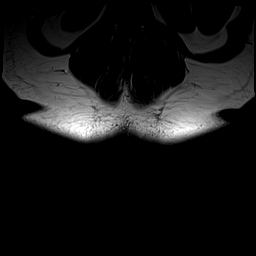

[Series 5: T2 · coronal · 5.0mm · 0.44mm/px · 5 of 24 slices shown]
[im 1/24]
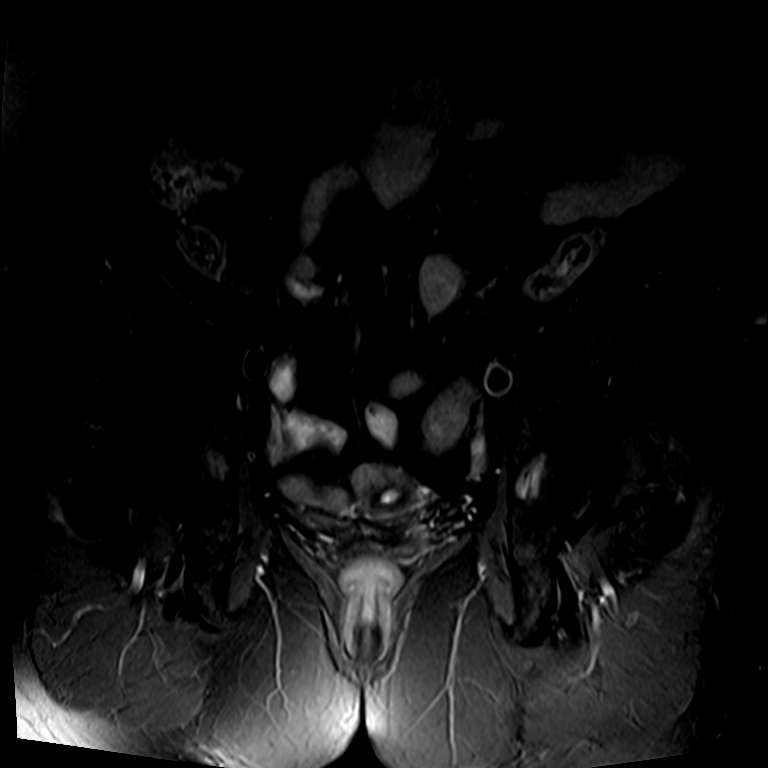
[im 6/24]
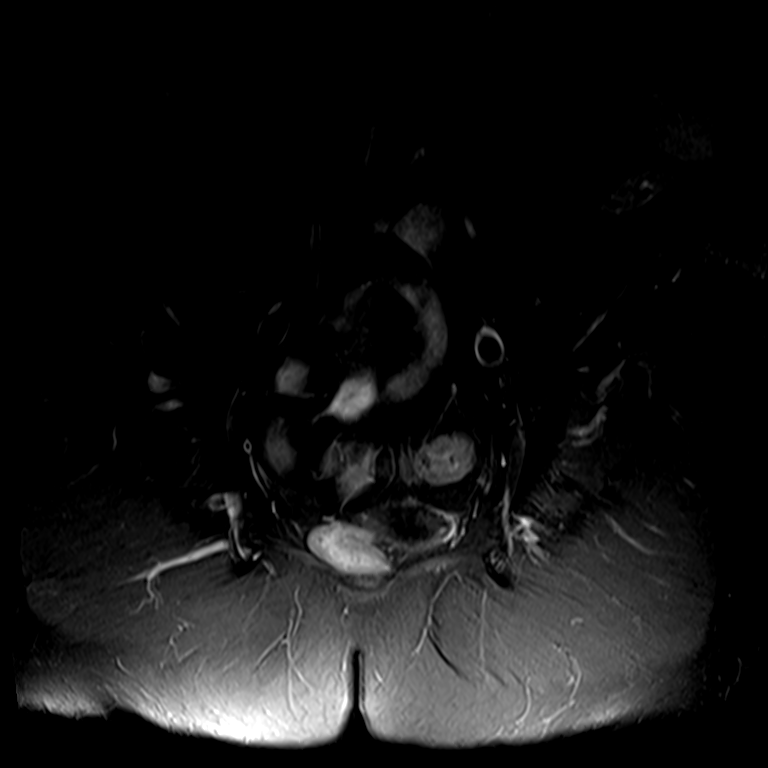
[im 12/24]
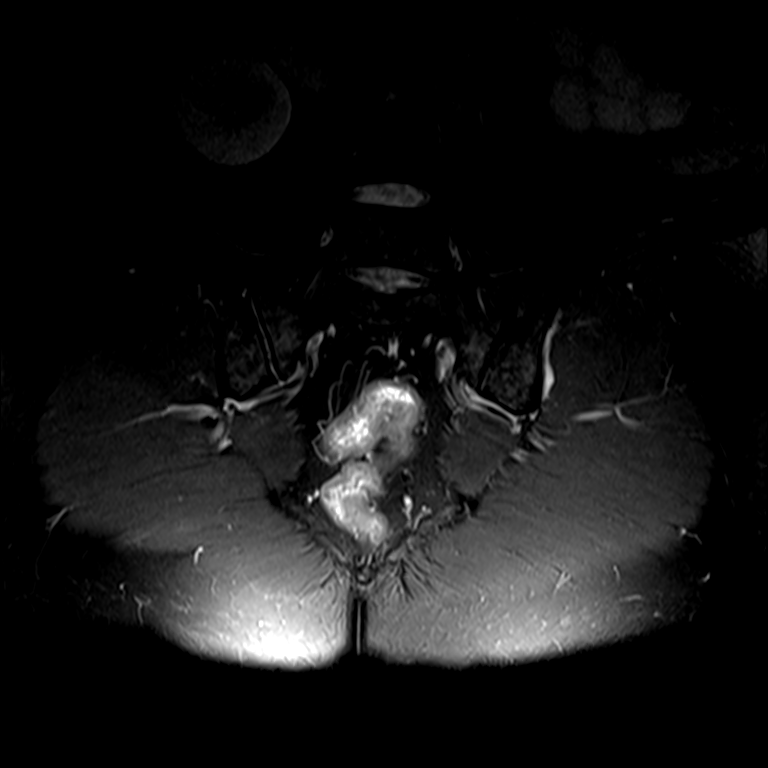
[im 18/24]
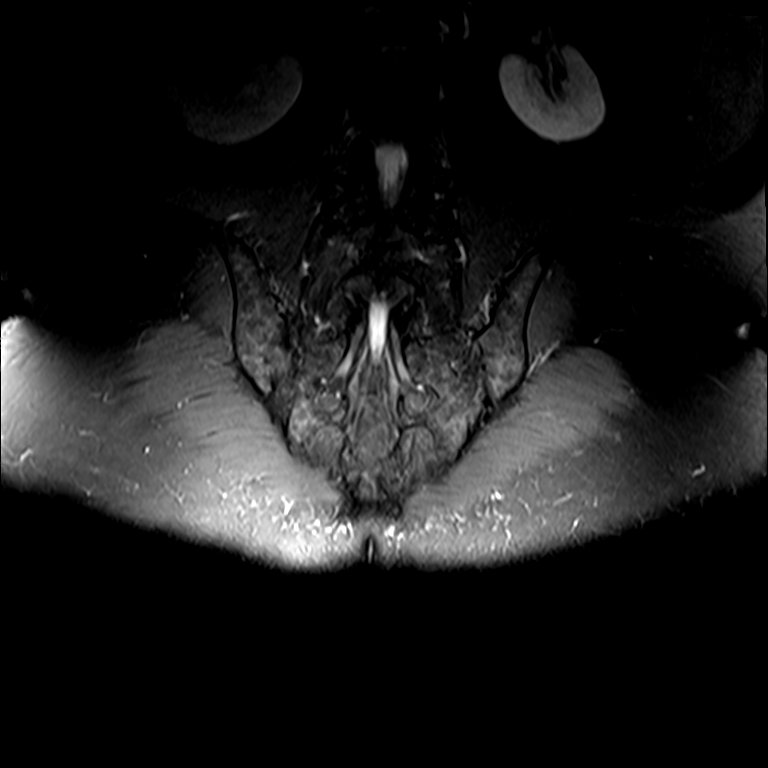
[im 24/24]
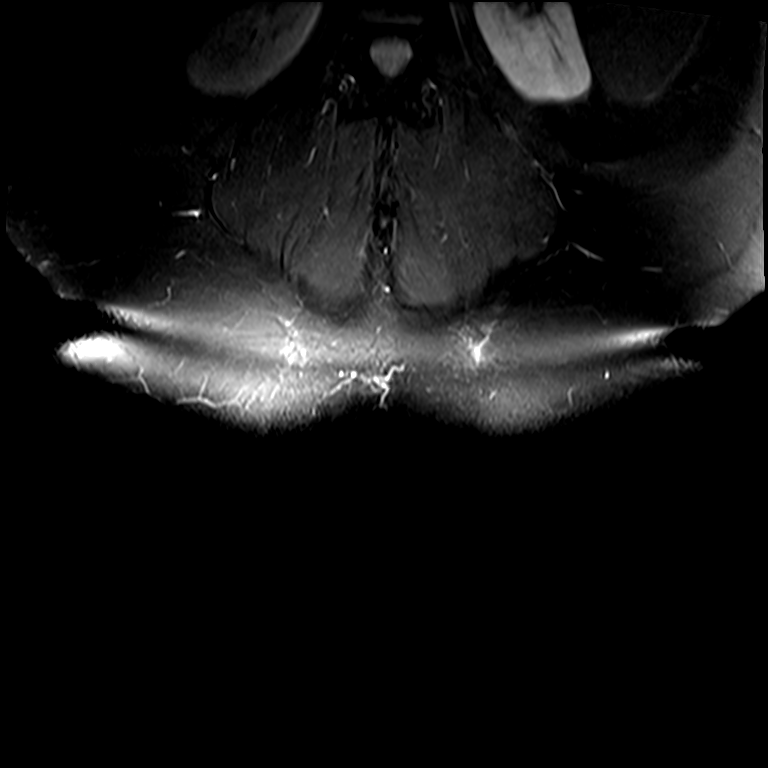

[Series 6: T1 · axial · 6.0mm · 0.74mm/px · z∈[-44,+154]mm · 5 of 25 slices shown (2 of 2)]
[im 1/25]
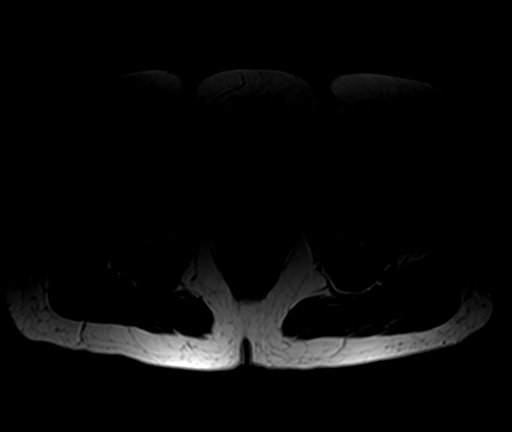
[im 7/25]
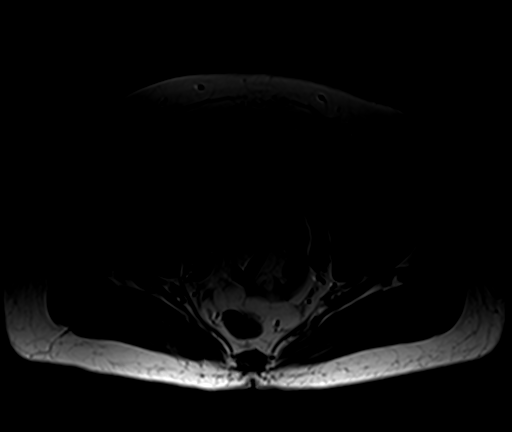
[im 13/25]
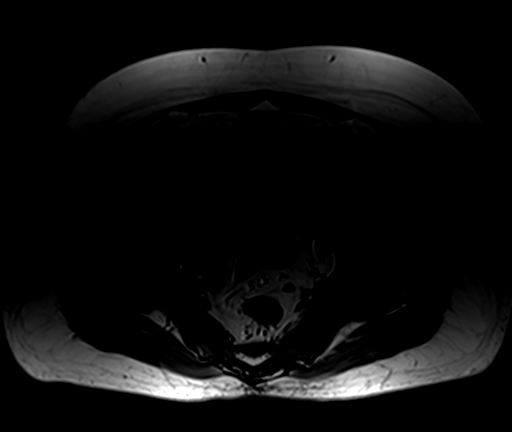
[im 19/25]
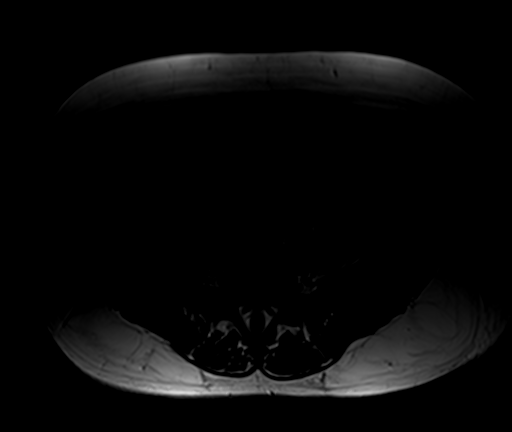
[im 25/25]
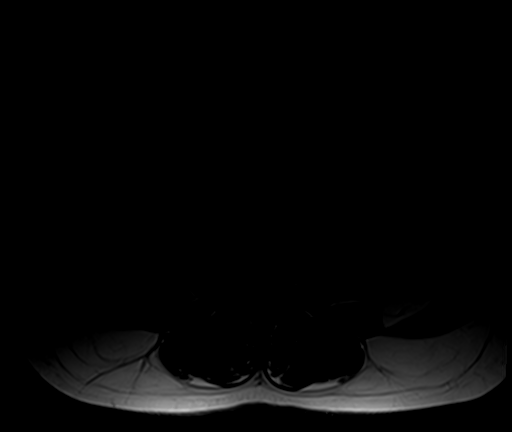

[Series 7: T2 fat-sat · axial · 6.0mm · 0.74mm/px · z∈[-36,+145]mm · 4 of 23 slices shown]
[im 1/23]
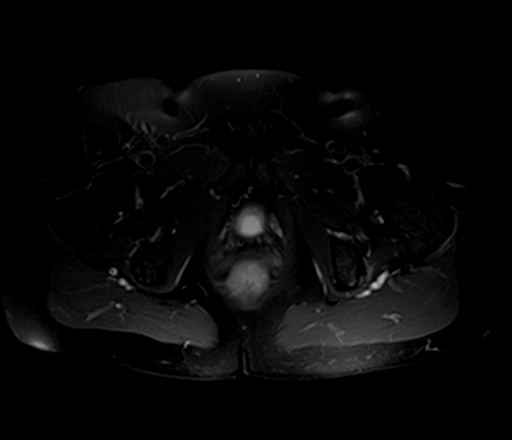
[im 6/23]
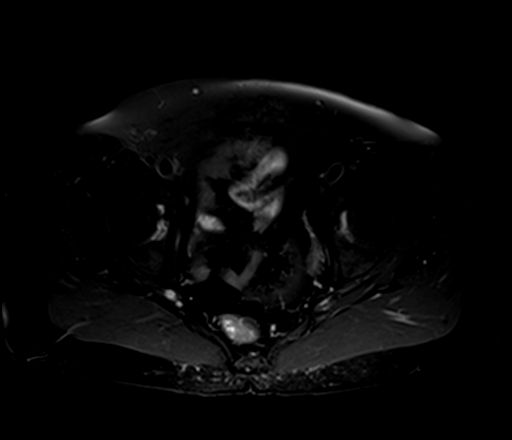
[im 12/23]
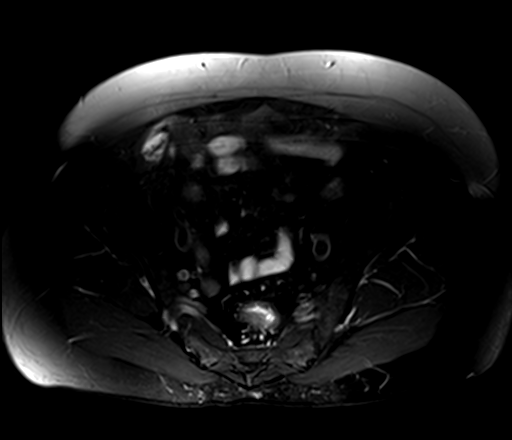
[im 23/23]
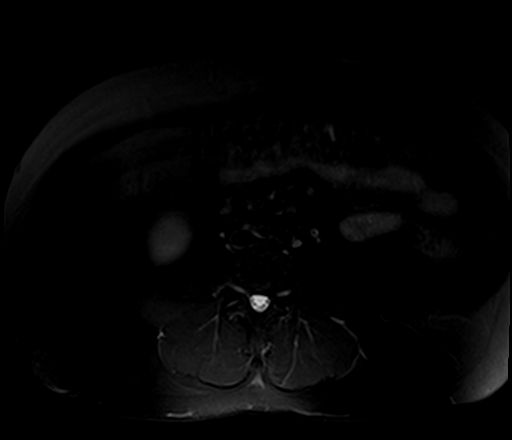

[18 of 48 positions shown; findings below may reference images not displayed]

FINDINGS: Diffuse moderately low T1 signal in the marrow of the pelvis,
moderately heterogeneous.

No erosion, widening, edema, or significant abnormality along the
sacroiliac joints.

Regional musculature normal and symmetric.

No mass lesion or significant abnormality along the expected
location of the sacral plexus. No sciatic notch impingement or
asymmetric edema or enhancement involving the visualize segments of
the sciatic nerves. No findings of obturator impingement.
IMPRESSION: 1. Low T1 marrow signal likely attributable to the patient's
hereditary spherocytosis.
2. No impingement along the sacral plexus or sciatic sciatic nerve
on either side to explain the patient's symptoms. No obturator
impingement identified.
3. No significant abnormality of the sacroiliac joints.
